# Patient Record
Sex: Male | Born: 1970 | Race: Black or African American | Hispanic: No | Marital: Married | Smoking: Current every day smoker
Health system: Southern US, Community
[De-identification: ages and names within clinical notes are randomized; demographics above are authoritative.]

## PROBLEM LIST (undated history)

## (undated) DIAGNOSIS — I10 Essential (primary) hypertension: Secondary | ICD-10-CM

## (undated) DIAGNOSIS — Z6836 Body mass index (BMI) 36.0-36.9, adult: Secondary | ICD-10-CM

## (undated) HISTORY — PX: CHOLECYSTECTOMY: SHX55

---

## 2003-12-22 ENCOUNTER — Emergency Department: Payer: Self-pay | Admitting: Emergency Medicine

## 2004-02-16 ENCOUNTER — Ambulatory Visit: Payer: Self-pay

## 2004-02-23 ENCOUNTER — Emergency Department: Payer: Self-pay | Admitting: Emergency Medicine

## 2005-05-21 ENCOUNTER — Emergency Department: Payer: Self-pay | Admitting: Emergency Medicine

## 2005-09-25 ENCOUNTER — Emergency Department: Payer: Self-pay | Admitting: Emergency Medicine

## 2005-10-04 ENCOUNTER — Emergency Department: Payer: Self-pay | Admitting: Emergency Medicine

## 2006-07-04 ENCOUNTER — Emergency Department: Payer: Self-pay | Admitting: Emergency Medicine

## 2006-07-04 ENCOUNTER — Other Ambulatory Visit: Payer: Self-pay

## 2006-10-16 ENCOUNTER — Emergency Department: Payer: Self-pay | Admitting: Emergency Medicine

## 2007-01-08 ENCOUNTER — Ambulatory Visit: Payer: Self-pay | Admitting: Pain Medicine

## 2007-07-23 ENCOUNTER — Emergency Department: Payer: Self-pay | Admitting: Emergency Medicine

## 2007-09-28 ENCOUNTER — Emergency Department: Payer: Self-pay | Admitting: Emergency Medicine

## 2008-01-26 ENCOUNTER — Emergency Department: Payer: Self-pay | Admitting: Emergency Medicine

## 2008-06-17 ENCOUNTER — Emergency Department: Payer: Self-pay | Admitting: Unknown Physician Specialty

## 2008-06-18 ENCOUNTER — Emergency Department: Payer: Self-pay | Admitting: Emergency Medicine

## 2008-07-27 ENCOUNTER — Emergency Department: Payer: Self-pay | Admitting: Emergency Medicine

## 2008-07-29 ENCOUNTER — Emergency Department: Payer: Self-pay | Admitting: Emergency Medicine

## 2008-08-23 ENCOUNTER — Emergency Department: Payer: Self-pay | Admitting: Emergency Medicine

## 2009-06-10 ENCOUNTER — Emergency Department: Payer: Self-pay | Admitting: Emergency Medicine

## 2010-01-31 ENCOUNTER — Emergency Department: Payer: Self-pay | Admitting: Emergency Medicine

## 2010-06-08 ENCOUNTER — Emergency Department: Payer: Self-pay | Admitting: Emergency Medicine

## 2010-06-28 ENCOUNTER — Emergency Department: Payer: Self-pay | Admitting: Emergency Medicine

## 2010-08-15 ENCOUNTER — Emergency Department: Payer: Self-pay | Admitting: Emergency Medicine

## 2010-08-29 ENCOUNTER — Emergency Department: Payer: Self-pay | Admitting: Emergency Medicine

## 2011-01-17 ENCOUNTER — Emergency Department: Payer: Self-pay | Admitting: Internal Medicine

## 2011-01-24 ENCOUNTER — Emergency Department: Payer: Self-pay | Admitting: Emergency Medicine

## 2011-10-02 ENCOUNTER — Emergency Department: Payer: Self-pay | Admitting: Emergency Medicine

## 2011-11-18 ENCOUNTER — Emergency Department: Payer: Self-pay | Admitting: Emergency Medicine

## 2011-11-18 LAB — COMPREHENSIVE METABOLIC PANEL
Albumin: 4.2 g/dL (ref 3.4–5.0)
Alkaline Phosphatase: 85 U/L (ref 50–136)
Anion Gap: 7 (ref 7–16)
Bilirubin,Total: 0.4 mg/dL (ref 0.2–1.0)
Creatinine: 1.14 mg/dL (ref 0.60–1.30)
EGFR (African American): >60
Glucose: 80 mg/dL (ref 65–99)
Osmolality: 277 (ref 275–301)
Potassium: 3.8 mmol/L (ref 3.5–5.1)
Sodium: 140 mmol/L (ref 136–145)
Total Protein: 7.8 g/dL (ref 6.4–8.2)

## 2011-11-18 LAB — CBC
HCT: 46.1 % (ref 40.0–52.0)
HGB: 16.2 g/dL (ref 13.0–18.0)
MCH: 31.8 pg (ref 26.0–34.0)
MCHC: 35.2 g/dL (ref 32.0–36.0)
MCV: 90 fL (ref 80–100)
Platelet: 208 10*3/uL (ref 150–440)
RBC: 5.11 10*6/uL (ref 4.40–5.90)

## 2011-11-18 LAB — LIPASE, BLOOD: Lipase: 171 U/L (ref 73–393)

## 2011-11-18 LAB — TROPONIN I: Troponin-I: <0.02 ng/mL

## 2012-01-26 ENCOUNTER — Emergency Department: Payer: Self-pay | Admitting: Emergency Medicine

## 2012-02-05 ENCOUNTER — Emergency Department: Payer: Self-pay | Admitting: Emergency Medicine

## 2012-08-15 ENCOUNTER — Emergency Department: Payer: Self-pay | Admitting: Emergency Medicine

## 2012-08-15 LAB — CBC
HCT: 44.2 % (ref 40.0–52.0)
HGB: 15.2 g/dL (ref 13.0–18.0)
MCH: 31.1 pg (ref 26.0–34.0)
MCHC: 34.4 g/dL (ref 32.0–36.0)
MCV: 90 fL (ref 80–100)
Platelet: 179 10*3/uL (ref 150–440)
RDW: 14 % (ref 11.5–14.5)
WBC: 12.8 10*3/uL — ABNORMAL HIGH (ref 3.8–10.6)

## 2012-08-15 LAB — URINALYSIS, COMPLETE
Bacteria: NONE SEEN
Bilirubin,UR: NEGATIVE
Glucose,UR: NEGATIVE mg/dL (ref 0–75)
Leukocyte Esterase: NEGATIVE
Nitrite: NEGATIVE
Ph: 6 (ref 4.5–8.0)
Protein: NEGATIVE
Squamous Epithelial: NONE SEEN
WBC UR: <1 /HPF (ref 0–5)

## 2012-08-15 LAB — COMPREHENSIVE METABOLIC PANEL
Alkaline Phosphatase: 90 U/L (ref 50–136)
Anion Gap: 6 — ABNORMAL LOW (ref 7–16)
BUN: 11 mg/dL (ref 7–18)
Calcium, Total: 9 mg/dL (ref 8.5–10.1)
Chloride: 108 mmol/L — ABNORMAL HIGH (ref 98–107)
Co2: 26 mmol/L (ref 21–32)
Creatinine: 1.03 mg/dL (ref 0.60–1.30)
EGFR (Non-African Amer.): >60
Glucose: 90 mg/dL (ref 65–99)
SGPT (ALT): 30 U/L (ref 12–78)

## 2012-11-11 ENCOUNTER — Emergency Department: Payer: Self-pay | Admitting: Emergency Medicine

## 2012-11-11 LAB — CBC WITH DIFFERENTIAL/PLATELET
Eosinophil #: 0.2 10*3/uL (ref 0.0–0.7)
HCT: 42.6 % (ref 40.0–52.0)
HGB: 14.9 g/dL (ref 13.0–18.0)
Lymphocyte %: 28.5 %
MCHC: 34.9 g/dL (ref 32.0–36.0)
MCV: 91 fL (ref 80–100)
Monocyte %: 5.8 %
Neutrophil %: 63.1 %
Platelet: 192 10*3/uL (ref 150–440)
RBC: 4.69 10*6/uL (ref 4.40–5.90)
RDW: 13.1 % (ref 11.5–14.5)

## 2012-11-11 LAB — DRUG SCREEN, URINE
Amphetamines, Ur Screen: NEGATIVE (ref ?–1000)
Barbiturates, Ur Screen: NEGATIVE (ref ?–200)
Cocaine Metabolite,Ur ~~LOC~~: NEGATIVE (ref ?–300)
MDMA (Ecstasy)Ur Screen: NEGATIVE (ref ?–500)
Methadone, Ur Screen: NEGATIVE (ref ?–300)
Opiate, Ur Screen: NEGATIVE (ref ?–300)
Phencyclidine (PCP) Ur S: NEGATIVE (ref ?–25)
Tricyclic, Ur Screen: NEGATIVE (ref ?–1000)

## 2012-11-11 LAB — URINALYSIS, COMPLETE
Bacteria: NONE SEEN
Blood: NEGATIVE
Glucose,UR: NEGATIVE mg/dL (ref 0–75)
Hyaline Cast: 3
Leukocyte Esterase: NEGATIVE
Specific Gravity: 1.033 (ref 1.003–1.030)
Squamous Epithelial: NONE SEEN
WBC UR: 1 /HPF (ref 0–5)

## 2012-11-11 LAB — BASIC METABOLIC PANEL
Anion Gap: 6 — ABNORMAL LOW (ref 7–16)
Chloride: 106 mmol/L (ref 98–107)
EGFR (African American): >60
EGFR (Non-African Amer.): >60
Osmolality: 274 (ref 275–301)

## 2012-11-11 LAB — SALICYLATE LEVEL: Salicylates, Serum: 10 mg/dL — ABNORMAL HIGH

## 2012-12-22 ENCOUNTER — Observation Stay: Payer: Self-pay | Admitting: Surgery

## 2012-12-22 DIAGNOSIS — E876 Hypokalemia: Secondary | ICD-10-CM

## 2012-12-22 DIAGNOSIS — I10 Essential (primary) hypertension: Secondary | ICD-10-CM

## 2012-12-22 DIAGNOSIS — R079 Chest pain, unspecified: Secondary | ICD-10-CM

## 2012-12-22 DIAGNOSIS — R1013 Epigastric pain: Secondary | ICD-10-CM

## 2012-12-22 LAB — HEPATIC FUNCTION PANEL A (ARMC)
Albumin: 3.9 g/dL (ref 3.4–5.0)
Alkaline Phosphatase: 79 U/L
Bilirubin, Direct: <0.1 mg/dL (ref 0.00–0.20)
Bilirubin,Total: 0.3 mg/dL (ref 0.2–1.0)
SGOT(AST): 43 U/L — ABNORMAL HIGH (ref 15–37)
SGPT (ALT): 77 U/L (ref 12–78)
Total Protein: 7.4 g/dL (ref 6.4–8.2)

## 2012-12-22 LAB — BASIC METABOLIC PANEL
Anion Gap: 5 — ABNORMAL LOW (ref 7–16)
Co2: 27 mmol/L (ref 21–32)
Creatinine: 1.02 mg/dL (ref 0.60–1.30)
Glucose: 112 mg/dL — ABNORMAL HIGH (ref 65–99)
Osmolality: 275 (ref 275–301)
Sodium: 138 mmol/L (ref 136–145)

## 2012-12-22 LAB — CBC
MCH: 31.8 pg (ref 26.0–34.0)
MCHC: 35.5 g/dL (ref 32.0–36.0)
MCV: 90 fL (ref 80–100)
RDW: 12.9 % (ref 11.5–14.5)
WBC: 17.5 10*3/uL — ABNORMAL HIGH (ref 3.8–10.6)

## 2012-12-22 LAB — TROPONIN I
Troponin-I: <0.02 ng/mL
Troponin-I: <0.02 ng/mL

## 2012-12-24 LAB — CBC WITH DIFFERENTIAL/PLATELET
Basophil %: 0.2 %
Eosinophil #: 0 10*3/uL (ref 0.0–0.7)
HCT: 46.7 % (ref 40.0–52.0)
HGB: 16.1 g/dL (ref 13.0–18.0)
Lymphocyte #: 2.1 10*3/uL (ref 1.0–3.6)
Lymphocyte %: 10.6 %
MCV: 92 fL (ref 80–100)
Monocyte %: 10.9 %
Neutrophil #: 15.6 10*3/uL — ABNORMAL HIGH (ref 1.4–6.5)
Neutrophil %: 78.3 %
Platelet: 182 10*3/uL (ref 150–440)
RBC: 5.1 10*6/uL (ref 4.40–5.90)
RDW: 13.3 % (ref 11.5–14.5)
WBC: 19.9 10*3/uL — ABNORMAL HIGH (ref 3.8–10.6)

## 2012-12-24 LAB — BILIRUBIN, DIRECT: Bilirubin, Direct: 1.4 mg/dL — ABNORMAL HIGH (ref 0.00–0.20)

## 2012-12-24 LAB — COMPREHENSIVE METABOLIC PANEL
Albumin: 3.5 g/dL (ref 3.4–5.0)
BUN: 7 mg/dL (ref 7–18)
Bilirubin,Total: 2.9 mg/dL — ABNORMAL HIGH (ref 0.2–1.0)
Calcium, Total: 8.8 mg/dL (ref 8.5–10.1)
Co2: 25 mmol/L (ref 21–32)
Creatinine: 0.97 mg/dL (ref 0.60–1.30)
EGFR (African American): >60
EGFR (Non-African Amer.): >60
Glucose: 102 mg/dL — ABNORMAL HIGH (ref 65–99)
Osmolality: 266 (ref 275–301)
Potassium: 3.6 mmol/L (ref 3.5–5.1)

## 2012-12-29 LAB — PATHOLOGY REPORT

## 2013-07-17 ENCOUNTER — Emergency Department: Payer: Self-pay | Admitting: Emergency Medicine

## 2013-08-16 ENCOUNTER — Emergency Department: Payer: Self-pay | Admitting: Emergency Medicine

## 2013-10-16 ENCOUNTER — Emergency Department: Payer: Self-pay | Admitting: Internal Medicine

## 2014-01-11 ENCOUNTER — Emergency Department: Payer: Self-pay | Admitting: Emergency Medicine

## 2014-01-31 ENCOUNTER — Emergency Department: Payer: Self-pay | Admitting: Emergency Medicine

## 2014-01-31 LAB — URINALYSIS, COMPLETE
BACTERIA: NONE SEEN
BILIRUBIN, UR: NEGATIVE
Glucose,UR: NEGATIVE mg/dL (ref 0–75)
KETONE: NEGATIVE
Leukocyte Esterase: NEGATIVE
Nitrite: NEGATIVE
Ph: 5 (ref 4.5–8.0)
Protein: 30
SQUAMOUS EPITHELIAL: NONE SEEN
Specific Gravity: 1.023 (ref 1.003–1.030)

## 2014-01-31 LAB — COMPREHENSIVE METABOLIC PANEL
ALK PHOS: 92 U/L
ALT: 41 U/L
Albumin: 4 g/dL (ref 3.4–5.0)
Anion Gap: 9 (ref 7–16)
BUN: 8 mg/dL (ref 7–18)
Bilirubin,Total: 0.5 mg/dL (ref 0.2–1.0)
CALCIUM: 8.9 mg/dL (ref 8.5–10.1)
Chloride: 102 mmol/L (ref 98–107)
Co2: 28 mmol/L (ref 21–32)
Creatinine: 1.2 mg/dL (ref 0.60–1.30)
EGFR (African American): >60
EGFR (Non-African Amer.): >60
Glucose: 100 mg/dL — ABNORMAL HIGH (ref 65–99)
Osmolality: 276 (ref 275–301)
POTASSIUM: 4.1 mmol/L (ref 3.5–5.1)
SGOT(AST): 27 U/L (ref 15–37)
Sodium: 139 mmol/L (ref 136–145)
Total Protein: 8.1 g/dL (ref 6.4–8.2)

## 2014-01-31 LAB — CBC WITH DIFFERENTIAL/PLATELET
Basophil #: 0.1 10*3/uL (ref 0.0–0.1)
Basophil %: 0.3 %
EOS PCT: 0.1 %
Eosinophil #: 0 10*3/uL (ref 0.0–0.7)
HCT: 46.6 % (ref 40.0–52.0)
HGB: 15.4 g/dL (ref 13.0–18.0)
LYMPHS ABS: 1.6 10*3/uL (ref 1.0–3.6)
Lymphocyte %: 9.6 %
MCH: 30.7 pg (ref 26.0–34.0)
MCHC: 33 g/dL (ref 32.0–36.0)
MCV: 93 fL (ref 80–100)
MONO ABS: 1.2 x10 3/mm — AB (ref 0.2–1.0)
Monocyte %: 7.3 %
Neutrophil #: 14 10*3/uL — ABNORMAL HIGH (ref 1.4–6.5)
Neutrophil %: 82.7 %
Platelet: 227 10*3/uL (ref 150–440)
RBC: 5 10*6/uL (ref 4.40–5.90)
RDW: 13.5 % (ref 11.5–14.5)
WBC: 16.9 10*3/uL — AB (ref 3.8–10.6)

## 2014-01-31 LAB — CLOSTRIDIUM DIFFICILE(ARMC)

## 2014-01-31 LAB — LIPASE, BLOOD: Lipase: 100 U/L (ref 73–393)

## 2014-02-01 LAB — WBCS, STOOL

## 2014-02-03 ENCOUNTER — Emergency Department: Payer: Self-pay | Admitting: Emergency Medicine

## 2014-03-02 LAB — STOOL CULTURE

## 2014-05-14 ENCOUNTER — Emergency Department
Admit: 2014-05-14 | Disposition: A | Discharge status: Home / Self Care | Payer: Self-pay | Admitting: Emergency Medicine

## 2014-05-21 NOTE — Consult Note (Signed)
General Aspect Joseph Stone is a 44yo male w/ no prior cardiac history PMHx s/f HTN, ongoing tobacco abuse and obesity who was admitted to [facility] today for suspected unstable angina.  He reports having an episode of constant substernal chest pain 1 month ago radiating to his left-sided chest lasting for several hours. No association with exertion. This remitted spontaneously after several hours. He does have a history of indigestion which occurs with certain trigger foods. He underwent a DOT evaluation last week at [facility]. EKG was performed which returned abnormal. Stress test was recommended which has yet to be completed.   He had an episode of abdominal pain and substernal chest pain radiating to his left-sided chest on Saturday. He reports this was similar to his prior episodes of indigestion. He had associated nausea and belching. He took some baking soda with gradual improvement and complete relief. This morning around 0030 he developed a similar episode with associated R arm "heaviness" and left neck/face numbness. The abdominal and chest discomfort were constant. He had an episode of NBNB x 2. He reports associated diaphoresis. Abdominal pain is always accompanied by chest pain. He denies SOB/DOE, PND, orthopnea, LE edema, palpitations, lightheadedness or syncope. No fevers, chills or abnormal bleeding. The discomfort worsened to a 10/10 this morning, and he presented to the ED.   Present Illness In the ED, he received a full dose ASA and NTG SL x 2 w/o relief. morphine sulfate was given with complete relief. EKG revealed inferolateral TWIs. Initial TnI WNL. CMP- K 3.1, AST- 43. Lipase, Mg WNL.  CBC- WBC 17.1K, otherwise WNL. CXR w/o acute abnormalities. Abdominal ultrasound revealed cholithiasis w/o cholecystitis.  PAST MEDICAL HISTORY:  HTN, tobacco abuse  PAST SURGICAL HISTORY:  None  ALLERGIES: No known drug allergies  FAMILY HISTORY: Father with MI in his mid to late 91s, passed of a MI at 66.    SOCIAL HISTORY: Lives in [place]. Works as a Dealer. Smokes 1 pack per day, 1-2 beers every month. Recently started taking 2 dietary/work-out supplements. Denies illicit drug use.   Physical Exam:  GEN no acute distress   HEENT pink conjunctivae, PERRL, hearing intact to voice   NECK supple  No masses  trachea midline  no JVD or bruits   RESP normal resp effort  clear BS  no use of accessory muscles   CARD Regular rate and rhythm  Normal, S1, S2  soft II/VI systolic flow murmur at LLSB   ABD soft  hypoactive BS  + epigastric tenderness   EXTR negative cyanosis/clubbing, negative edema   SKIN normal to palpation, skin turgor good   NEURO follows commands, motor/sensory function intact   PSYCH alert, A+O to time, place, person   Review of Systems:  Subjective/Chief Complaint abdominal pain   Cardiovascular: Chest pain or discomfort   Gastrointestinal: Nausea  abdominal pain   Review of Systems: All other systems were reviewed and found to be negative   Home Medications: Medication Instructions Status  amLODIPine 10 mg oral tablet 1 tab(s) orally once a day Active   Lab Results:  Hepatic:  24-Nov-14 04:32   Bilirubin, Total 0.3  Bilirubin, Direct < 0.1 (Result(s) reported on 22 Dec 2012 at 05:31AM.)  Alkaline Phosphatase 79 (45-117 NOTE: New Reference Range 12/19/12)  SGPT (ALT) 77  SGOT (AST)  43  Total Protein, Serum 7.4  Albumin, Serum 3.9  Routine Chem:  24-Nov-14 04:32   Magnesium, Serum 1.9 (1.8-2.4 THERAPEUTIC RANGE: 4-7 mg/dL TOXIC: > 10 mg/dL  -----------------------)  Lipase 137 (Result(s) reported on 22 Dec 2012 at 05:26AM.)  Glucose, Serum  112  BUN 9  Creatinine (comp) 1.02  Sodium, Serum 138  Potassium, Serum  3.1  Chloride, Serum 106  CO2, Serum 27  Calcium (Total), Serum 8.8  Anion Gap  5  Osmolality (calc) 275  eGFR (African American) >60  eGFR (Non-African American) >60 (eGFR values <68m/min/1.73 m2 may be an indication of  chronic kidney disease (CKD). Calculated eGFR is useful in patients with stable renal function. The eGFR calculation will not be reliable in acutely ill patients when serum creatinine is changing rapidly. It is not useful in  patients on dialysis. The eGFR calculation may not be applicable to patients at the low and high extremes of body sizes, pregnant women, and vegetarians.)  Cardiac:  24-Nov-14 04:32   Troponin I < 0.02 (0.00-0.05 0.05 ng/mL or less: NEGATIVE  Repeat testing in 3-6 hrs  if clinically indicated. >0.05 ng/mL: POTENTIAL  MYOCARDIAL INJURY. Repeat  testing in 3-6 hrs if  clinically indicated. NOTE: An increase or decrease  of 30% or more on serial  testing suggests a  clinically important change)  Routine Hem:  24-Nov-14 04:32   WBC (CBC)  17.5  RBC (CBC) 4.87  Hemoglobin (CBC) 15.5  Hematocrit (CBC) 43.6  Platelet Count (CBC) 208 (Result(s) reported on 22 Dec 2012 at 04:45AM.)  MCV 90  MCH 31.8  MCHC 35.5  RDW 12.9   EKG:  Interpretation NSR, TWIs V4-V6, II, III, aVF   Rate 85   EKG Comparision Not changed from  05/2010 or 10/2011 tracings    No Known Allergies:   Vital Signs/Nurse's Notes: **Vital Signs.:   24-Nov-14 07:50  Vital Signs Type Admission  Temperature Temperature (F) 97.7  Celsius 36.5  Temperature Source oral  Pulse Pulse 79  Respirations Respirations 18  Systolic BP Systolic BP 1295 Diastolic BP (mmHg) Diastolic BP (mmHg) 95  Mean BP 118  Pulse Ox % Pulse Ox % 97  Pulse Ox Activity Level  At rest  Oxygen Delivery Room Air/ 21 %  *Intake and Output.:   24-Nov-14 07:50  Weight Type admission  Weight Method Bed  Current Weight (lbs) (lbs) 222  Current Weight (kg) (kg) 100.6  Height Type stated  Height (ft) (feet) 5  Height (in) (in) 8  Height (cm) centimeters 172.7  BSA (m2) 2.1  BMI (kg/m2) 33.7    Impression 89yomale w/ no prior cardiac history PMHx s/f HTN, ongoing tobacco abuse and obesity [facility]  1. Epigastric/chest pain The patient reports a history of sharp epigastric pain with associated substernal chest pain radiating to his left-sided chest with associated R arm "heaviness" and left neck/face tingling. He had two such episodes this weekend lasting for several hours. He had multiple episodes of NBNB emesis. He relates the discomfort to prior episodes of indigestion. He has had no prior exertional chest pain or dyspnea. Objectively, initial TnI WNL. EKG does show anterolateral TWIs, but this is unchanged since 2012. Lipase WNL. CXR unremarkable. Abdominal u/s w/ cholelithiasis w/o cholecystitis. Systolic murmur on exam may be evidence of LVH vs aortic sclerosis/stenosis. Cardiac RFs include HTN, tobacco abuse, obesity. Father with MI in his 573s Unclear that this represents unstable angina. Symptoms fit more of a GI picture. Chest discomfort is always preceded by abdominal pain. This was not responsive to NTG. Non-exertional. He has epigastric tenderness to palpation. He works as a mDealer  and has not been limited functionally.  -- Agree with cycling troponins -- Will proceed with stress test. Unclear that this represents USAP; however, given baseline EKG changes, will evaluate for cardiac ischemia.  -- Obtain 2D echo -- GI cocktail PRN -- Low-dose ASA, start BB, NTG SL PRN -- Risk stratify with lipid panel, Hgb A1C -- Consider UDS  2. Hypertension -- Continue Norvasc -- Add carvedilol  3. Ongoing tobacco abuse Deciding on whether he is willing to quit this admission. Discussed the risks of tobacco use and options for nicotine replacement therapy.  -- Advised tobacco cessation  4. Leukocytosis WBC 17K. Afebrile. Possible reactive vs GI etiologies.  -- Continue to follow. Evaluation of infection process per primary team  5. Hypokalemia -- Replete   Electronic Signatures for Addendum Section:  Kathlyn Sacramento (MD) (Signed Addendum  [phone] 10:33)  The patient was seen and examined. Agree with the above. His symptoms are overall atypical and seem to be GI in nature. He does have TW changes on ECG but these do not seem to be new. Exam reveals no murmurs.  Agree with serial cardiac enzymes and risk stratification with a stress test. Consider outpatient GI evaluation if symptoms persist.   Electronic Signatures: Meriel Pica (PA-C)  (Signed 24-Nov-14 09:37)  Authored: General Aspect/Present Illness, History and Physical Exam, Review of System, Home Medications, Labs, EKG , Allergies, Vital Signs/Nurse's Notes, Impression/Plan Kathlyn Sacramento (MD)  (Signed [phone] 10:33)  Co-Signer: General Aspect/Present Illness, Home Medications, Labs, Allergies   Last Updated: 24-Nov-14 10:33 by Kathlyn Sacramento (MD)

## 2014-05-21 NOTE — Consult Note (Signed)
Brief Consult Note: Diagnosis: Choledocholithiasis.   Patient was seen by consultant.   Consult note dictated.   Recommend to proceed with surgery or procedure.   Discussed with Attending MD.   Comments: Mr. Joseph Stone is a pleasant 44 y/o male with acute cholecystitis with plans for cholecystectomy today, however his bilirubin has elevated to 2.9 along with transaminitis.  Likely he has choledocholithiasis.  ERCP planned for today with Dr. Servando SnareWohl for stone extraction, possible sphincterotomy & stent if needed.  Discussed risks/benefits of procedure which include but are not limited to pancreatitis, bleeding, infection, perforation & drug reaction.  Patient agrees with this plan & consent will be obtained.  Discussed with both Dr Anda KraftMarterre & Dr Servando SnareWohl & all are in agreement with plan.  Plan: 1) NPO 2) Consent 3) ERCP w/ Wohl 4) Continue supportive measures 5) Cholecystectomy post ERCP per surgery  Thanks for consult.  Please see full dictated note. #[id number].  Electronic Signatures: Joselyn ArrowJones, Sabin Gibeault L (NP)  (Signed [phone]-Nov-14 15:51)  Authored: Brief Consult Note   Last Updated: 26-Nov-14 15:51 by Joselyn ArrowJones, Kemyah Buser L (NP)

## 2014-05-21 NOTE — Discharge Summary (Signed)
PATIENT NAME:  Joseph BillingsWADE, Joseph Stone MR#:  [id number] DATE OF BIRTH:  11-13-1970  DATE OF ADMISSION:  12/22/2012 DATE OF DISCHARGE:  12/25/2012.  PRINCIPLE DIAGNOSIS:  Chronic active cholecystitis (chronic cholecystitis and acute cholecystitis) with choledocholithiasis.   OTHER DIAGNOSES:  Hypertension.   PRINCIPAL PROCEDURE PERFORMED DURING THIS ADMISSION:  Laparoscopic cholecystectomy, 12/25/2012.   OTHER PROCEDURES PERFORMED DURING THIS ADMISSION:  Endoscopic retrograde cholangiopancreatography, 12/24/2012.   HOSPITAL COURSE:  The patient was admitted to the hospital with chest pain but ultimately determined to have acute cholecystitis and was treated with IV antibiotics but then his bilirubin went up to 2.9 from an admission value of 0.3. His white blood cell count went up from 17 to 19.9. He underwent ERCP and had a sphincterotomy and 7-mm stone removed from his common bile duct and the following morning underwent laparoscopic cholecystectomy. He insisted on discharge following surgery. The patient was discharged home on Percocet and Norco and instructed only to take one of these two at a time and not to use Tylenol when using either of them. He was given an appointment to see me in 1 to 2 weeks and asked to call the office in the interim for a recurrence of his pain, fever, or jaundice symptoms.  ____________________________ Claude MangesWilliam F. Diyari Cherne, MD wfm:jm D: 12/25/2012 10:56:00 ET T: 12/25/2012 11:22:44 ET JOB#: [id number]  cc: Claude MangesWilliam F. Rivers Gassmann, MD, <Dictator>

## 2014-05-21 NOTE — H&P (Signed)
PATIENT NAME:  Joseph Stone, Joseph Stone MR#:  [id number] DATE OF BIRTH:  [birth year]  DATE OF ADMISSION:  12/22/2012  [facility]   REQUESTING PHYSICIAN: Dr. Zenda AlpersWebster.  CHIEF COMPLAINT: Chest pain/abdominal pain.  HISTORY OF PRESENT ILLNESS: The patient is a 44 year old male with a history of hypertension and smoking who is being admitted for suspected unstable angina. The patient had a DOT Physical last week where he was found to have abnormal EKG, [facility] and was requested to get a stress test. Last night he started having epigastric pain, which was unbearable, about 10 of 10, woke up from sleep around 12:30 to 1:00 a.m.. His pain was unrelenting and decided to come to the Emergency Department. While in the ED, he started having radiation to the left arm and his pain was getting more under his left lower chest. He also had some numbness in the arm and jaw. He was found to have some flipped T waves in leads II, III, and aVF on EKG, and he is being admitted for further evaluation and management. His pain is still 9 out of 10 in severity and seems uncomfortable. He had nausea, vomiting several tiems since last night, No fever. He does admit to starting some new diet pill last Saturday.  PAST MEDICAL HISTORY: Hypertension.   SOCIAL HISTORY: Smokes 1 pack of cigarettes daily. He has been trying to quit. He drinks occasionally. He works as a Artistdeiseal mechanic.   FAMILY HISTORY: Father had MI.  HOME MEDICATIONS: Norvasc 10 mg p.o. daily.   ALLERGIES: No known drug allergies.  REVIEW OF SYSTEMS: CONSTITUTIONAL: No fever, fatigue, weakness.  EYES: No blurred or double vision.  ENT: No tinnitus or ear pain.  RESPIRATORY: No cough, wheezing, hemoptysis.  CARDIOVASCULAR: Positive for chest pain. No orthopnea or edema.  GASTROINTESTINAL: Positive for nausea and vomiting and a couple of times. No diarrhea. Also positive for epigastric pain. GENITOURINARY: No  dysuria or hematuria.  ENDOCRINE: No polyuria or nocturia.  HEMATOLOGIC: No anemia or easy bruising.  SKIN: No rash or lesion.  MUSCULOSKELETAL: No arthritis or muscle cramp.  NEUROLOGIC: No tingling, numbness, weakness.  PSYCHIATRIC: No history of anxiety or depression.   PHYSICAL EXAMINATION: VITAL SIGNS: Temperature 97.7, heart rate 88 per minute, respirations 22 per minute, blood pressure 187/96 mmHg, and he is saturation 98% on room air. GENERAL: The patient is a 44 year old male lying in the bed in some pain.  EYES: Pupils equal, round, and reactive to light and accommodation. No scleral icterus. Extraocular muscles intact.  HEENT: Head atraumatic, normocephalic. Oropharynx and nasopharynx clear.  NECK: Supple. No jugular venous distention. No thyroid enlargement or tenderness.  LUNGS: Clear to auscultation bilaterally. No wheezing, rales, rhonchi, or crepitation.  CARDIOVASCULAR: S1 and S2 normal. No murmurs, rubs, or gallop.  ABDOMEN: Soft, nontender, and nondistended. Bowel sounds present. No organomegaly or mass.  EXTREMITIES: No pedal edema, cyanosis, or clubbing. NEUROLOGIC: Nonfocal examination. Cranial nerves II through XII intact. Muscle strength 5/5 in all extremities. Sensation intact. PSYCH:  The patient is alert and oriented x3.  SKIN: No obvious rash, lesion, or ulcer.   LABORATORY AND DIAGNOSTICS: Normal BMP, except Potassium of 3.1. Normal liver function tests, except AST of 43. Normal first set of troponins. Normal CBC except white count of 17.5.   Chest x-ray in the Emergency Department showed no acute cardiopulmonary disease.   EKG showed flipped T waves in leads II, III, an aVF compared to previous  EKG. Normal sinus rhythm, heart rate of 85 beats per minute.   Abdominal ultrasound showed cholelithiasis with stones and sludge in the gallbladder, unchanged from previous study.   IMPRESSION AND PLAN: 1.  Suspected unstable angina. Will start him on aspirin and  nitroglycerin, consult cardiology and obtain Myoview.  2.  Abnormal EKG with flipped T waves in leads II, III, and aVF. Discussed with cardiology, Dr. Kirke Corin. For now we will scheduled him for stress test and start him on aspirin and nitroglycerin.  3.  Hypokalemia. We will replete and recheck. Check magnesium.  4.  Hypertension. We will continue Norvasc, and we will admit him to telemetry.  5.  Tobacco abuse. The patient was counseled for about 3 minutes. He denies any need for nicotine replacement therapy while in the hospital. He is trying to quit.  This certainly could be gallbladder also. If he continues having pain, consider surgical consult here otherwise outpatient evaluation may be ok.   CODE STATUS: FULL CODE.   TOTAL TIME TAKING CARE OF THIS PATIENT: 55 minutes.  ____________________________ Ellamae Sia. Sherryll Burger, MD vss:sb D: 12/22/2012 07:05:01 ET T: 12/22/2012 07:48:06 ET JOB#: [id number]  cc: Vontrell Pullman S. Sherryll Burger, MD, <Dictator> Ellamae Sia [facility] MD ELECTRONICALLY SIGNED 12/29/2012 10:05

## 2014-05-21 NOTE — Op Note (Signed)
PATIENT NAME:  Joseph Stone, Joseph Stone MR#:  [id number] DATE OF BIRTH:  04-27-1970  DATE OF PROCEDURE:  12/25/2012  PREOPERATIVE DIAGNOSIS: Acute cholecystitis.  POSTOPERATIVE DIAGNOSIS: Chronic active cholecystitis (acute and chronic cholecystitis).   PROCEDURE PERFORMED: Laparoscopic cholecystectomy.  SURGEON: Duwaine MaxinWilliam Annella Prowell, MD   ANESTHESIA: General.   PROCEDURE IN DETAIL: The patient was placed supine on the operating room table and prepped and draped in the usual sterile fashion. A 15 mmHg CO2 pneumoperitoneum was created via a Veress needle, in the infraumbilical position, and this was replaced with a 30 degree angled laparoscope through a 5 mm trocar. Remaining trocars were placed under direct visualization. The gallbladder was decompressed as much as I could of bile, and the gallbladder wall was exceedingly thick and very difficult to grasp, but ultimately the fundus was grasped and retracted superiorly and ventrally and very dense, thick, and chronic adhesions to the visceral surface of the gallbladder were taken down with the electrocautery. Ultimately, the infundibulum was dissected out and retracted laterally and extreme retraction of the gallbladder to the right side opened up the triangle of Calot. Staying right on the gallbladder wall, the cystic artery was identified and doubly clipped and divided. The cystic duct was identified and doubly clipped with Hem-O-Lok clips and divided, and the gallbladder was removed from the liver bed with the electrocautery. It was placed in an Endo Catch bag and extracted from the abdomen via the supraumbilical midline port site. This port site required significant enlargement (to approximally 5 cm) due to the thickness of the wall of the gallbladder. The peritoneum was temporarily desufflated and the linea alba was closed with a running 0 PDS suture extracorporeally and then the peritoneum was re-insufflated and re-inspected, and the right upper quadrant was  hemostatic with no evidence of bile staining. Copious amounts of warm irrigation, in the right upper quadrant, was performed and this was all suctioned clear. All of the clips were secure. The peritoneum was then desufflated and decannulated, and all 4 skin sites were closed with subcuticular 5-0 Monocryl and suture strips. The patient tolerated the procedure well. There were no complications.  ____________________________ Claude MangesWilliam F. Nelani Schmelzle, MD wfm:sb D: 12/25/2012 10:47:48 ET T: 12/25/2012 11:15:07 ET JOB#: [id number]  cc: Claude MangesWilliam F. Mateusz Neilan, MD, <Dictator> Claude MangesWILLIAM F Tehran Rabenold MD ELECTRONICALLY SIGNED 12/26/2012 8:44

## 2014-05-21 NOTE — Consult Note (Signed)
PATIENT NAME:  Joseph Stone, Joseph Stone MR#:  [id number] DATE OF BIRTH:  [birth year]  DATE OF CONSULTATION:  12/22/2012  REFERRING PHYSICIAN:   CONSULTING PHYSICIAN:  Joseph Trupiano A. Egbert Garibaldi, MD  REASON FOR CONSULTATION: Abdominal pain and cholelithiasis.   HISTORY: This is a 44 year old male, admitted to the medical service with chest and abdominal pain. He was seen to have an abnormal EKG, which appears to be unchanged from previous visits. He has been seen by cardiology. Stress test is negative. Feeling is that this is more of GI origin.   The patient does admit to previous episodes of this type of discomfort in the past, which is accompanied by indigestion, some mild nausea, but no history of emesis, no jaundice and no fever. Pain typically occurs in his upper middle abdomen and his right upper quadrant on previous occasions in the past, over the last year and a half to two years. He describes at least 10 different episodes in the past. These are usually after fatty meals or spicy meals. Of note, the patient was seen in the Emergency Room in October 2013 with abdominal pain, was diagnosed with suspected biliary colic after an ultrasound at the time demonstrated cholelithiasis. The patient states that he was seen at [facility] surgery, and was told that he would likely benefit from a cholecystectomy or that he could try dietary changes. That was approximately one year ago.   He was admitted with severe upper abdominal pain which started on Saturday.  Currently, he is still having some discomfort, although somewhat improved compared to admission. His abdominal pain did  not respond wo treatment with sublingual nitroglycerin, however, he did respond to relief of his pain with morphine in the Emergency Room. Stress test earlier today in nuclear medicine is unremarkable. He has been seen by Dr. Kirke Corin of cardiology. The patient does have a history of hypertension, tobacco abuse and dependence. He has had no previous abdominal  operations. Surgical services were asked to evaluate for possible cholecystectomy.  ALLERGIES: NONE.   MEDICATIONS AT HOME: Amlodipine 10 mg by mouth once a day.   INPATIENT MEDICATIONS: Norvasc, nitroglycerin patch, Protonix, aspirin, morphine, Zofran, enteric coated aspirin, Tylenol, Percocet, Mylanta, Colace, oxygen, Senokot, IV fluids.   PAST MEDICAL HISTORY: Hypertension, mild obesity.   PAST SURGICAL HISTORY: None.   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 He denies any exertional chest pain or shortness of breath.   REVIEW OF SYSTEMS: As described above. No fever. No jaundice. No weakness. No shortness of breath.   PHYSICAL EXAMINATION: GENERAL:  The patient is alert and oriented, comfortable-appearing. No obvious distress.  VITAL SIGNS: Temperature is 98.5, pulse of 80, blood pressure 175/100. Pulse oximetry on room air is 95%.  LUNGS: Clear.  HEART: Regular rate and rhythm.  ABDOMEN:  Soft, tender in the right upper quadrant and mid-epigastrium, with Joseph Stone sign is present. No hernias. No masses. No scars.  EXTREMITIES: Warm and well perfused.  NEUROLOGIC AND PSYCHIATRIC: Unremarkable.  RECTAL AND GENITOURINARY: Deferred.   LABORATORY VALUES: Glucose 112, BUN 9, creatinine 1.02, sodium 138, potassium 3.1, chloride 106, CO2 is 27. Liver function tests are normal except for AST slightly elevated at 4.3. Troponins x3 q.8 hours are all negative. White count is 17.5, hemoglobin 15.5, hematocrit 43.6, platelet count 208,000. Review of chest x-ray is unremarkable. Review of ultrasound demonstrates multiple stones and sludge. No gallbladder wall thickening. At that time a  negative Joseph Stone sign was present. Bile duct  measured 5.3 mm in size, which is normal. Normal liver echogenicity, and no focal lesions seen. It was similar in appearance to previous study dated 11/19/2011.   IMPRESSION: This is a 44 year old black male with a history of  hypertension and 1 pack per day cigarette abuse and dependence, presenting with abdominal and chest pain, with negative cardiac work-up but abnormal EKG from previous admission. His symptom complex appears to be consistent with cholelithiasis and possibly acute cholecystitis, given his elevated white count. The duration of his symptoms suggestive of an evolving gallbladder hydrops.   RECOMMENDATIONS: At present, I would attempt a HIDA scan tomorrow morning to visualize the cystic duct and gallbladder. If this is positive for cystic duct obstruction, the patient would benefit from cholecystectomy during this admission. The patient is in agreement with this plan.   TOTAL TIME SPENT: 45 minutes.     ____________________________ Redge GainerMark A. Egbert GaribaldiBird, MD mab:cg D: 12/22/2012 22:26:03 ET T: 12/23/2012 00:59:30 ET JOB#: [id number]  cc: Loraine LericheMark A. Egbert GaribaldiBird, MD, <Dictator> Kendarius Vigen A Emannuel Vise MD ELECTRONICALLY SIGNED 12/23/2012 21:18

## 2014-05-21 NOTE — Consult Note (Signed)
PATIENT NAME:  Joseph BillingsWADE, Oryn C MR#:  [id number] DATE OF BIRTH:  [birth year]  DATE OF CONSULTATION:  12/24/2012  REQUESTING PHYSICIAN:  Dr. Anda KraftMarterre  CONSULTING PHYSICIAN:  Gastroenterologist Dr. Okey Duprearren Wohl/Fleta Borgeson, NP  PRIMARY CARE PHYSICIAN:  Not applicable.   REASON FOR CONSULTATION: Choledocholithiasis.   HISTORY OF PRESENT ILLNESS: Mr. Leone BrandWaite is a pleasant 44 year old black male who was in his usual state of health until 4 days ago when he developed severe chest and abdominal pain. He tells me the pain was very severe. It did not radiate to his back; however, it was throughout his entire abdomen and chest. The pain was 10 out of 10 on pain scale. The pain today is two out of 10. He also had multiple episodes of nausea and vomiting as well as cold sweats with the pain. He denies any jaundice or pruritus. He denies any fever. He had a white blood cell count of 17.5 on arrival, 19.9 today. Total bilirubin jumped to 2.9 today after having been normal upon admission. His alkaline phosphatase also went from 79 to 207, AST from 43 to 145, and ALT 77 to 384. He had an ultrasound which showed multiple stones and gallbladder sludge and a common bile duct of 5.3. He had a HIDA scan today, which showed nonfilling of the gallbladder after 120 minutes, highly concerning for acute cholecystitis.    [facility] and was told that he had "gallbladder problems"; however, he has been putting off having cholecystectomy and trying to change his diet first.  PAST MEDICAL AND SURGICAL HISTORY: Hypertension, obesity, and tobacco abuse, allergic rhinitis.  No surgeries.   MEDICATIONS PRIOR TO ADMISSION: Amlodipine 10 mg daily.   ALLERGIES: No known drug allergies.   FAMILY HISTORY: There is no family history of  colorectal carcinoma, liver or chronic GI problems.   SOCIAL HISTORY: He is single. Has three healthy grown children. He has a 10 pack-year history of tobacco abuse. He consumes 3 to 4  beers per month. He denies any illicit drug use. He is a Games developerdiesel mechanic.   REVIEW OF SYSTEMS: See history of present illness.  NEUROLOGICAL: He has had a headache since admission to the hospital which he feels may be due to some of the pain medications that he is on. Denies any neurologic symptoms with it including dizziness, change in vision or weakness. Otherwise, negative 12 point review of systems.   PHYSICAL EXAMINATION: VITAL SIGNS: Temperature 97, pulse 111, respirations 19, blood pressure 114/72, O2 sat 96% on room air.  GENERAL: He is a well-developed, well-nourished male in no acute distress. He is accompanied by his mother and sister.  HEENT: Sclerae clear, anicteric. Conjunctivae pink. Oropharynx pink and moist without any lesions.  NECK: Supple without mass or thyromegaly.  CHEST: Heart regular rhythm. Normal S1, S2. No murmurs, clicks, rubs or gallops.  LUNGS: Clear to auscultation bilaterally.  ABDOMEN: Positive bowel sounds x 4. No bruits auscultated. Abdomen is soft, nondistended. He does have moderate tenderness to the epigastrium and right upper quadrant on deep palpation. There is no rebound, tenderness or guarding.  EXTREMITIES: Without clubbing or edema.  SKIN: Warm and dry without any rash or jaundice.  NEUROLOGIC: Grossly intact.  PSYCHIATRIC: Alert, oriented. Normal mood and affect.   LABORATORY STUDIES: See history of present illness. Glucose 102, sodium 134, otherwise normal basic metabolic panel. Lipase 137. Magnesium 1.9, total protein 7.4, albumin 3.5. Troponins negative x 3, hemoglobin 16.1, hematocrit 46.7, platelets 182.  IMPRESSION: Mr. Ator is a pleasant 44 year old black male with acute cholecystitis with plans for cholecystectomy today; however, his bilirubin is elevated to 2.9 along with transaminitis. Likely he has choledocholithiasis. ERCP is planned for today with Dr. Servando Snare for stone extraction, possible sphincterotomy and stent if needed. Discussed  risks, benefits, and procedures which include but not limited to bleeding, infection, perforation, drug reaction, and pancreatitis. The patient agrees with the plan and consent will be obtained. I have discussed with both Dr. Anda Kraft and Dr. Servando Snare and all are in agreement with the plan.   PLAN: 1.  N.p.o.  2.  Consent will be obtained.  3.  ERCP with Dr. Servando Snare.  4.  Continue supportive measures.  5.  Cholecystectomy post ERCP per surgery.    ____________________________ Joselyn Arrow, NP klj:dp D: 12/24/2012 15:50:26 ET T: 12/24/2012 16:25:27 ET JOB#: [id number]  cc: Joselyn Arrow, NP, <Dictator> Joselyn Arrow FNP ELECTRONICALLY SIGNED 01/05/2013 15:33

## 2014-05-21 NOTE — Consult Note (Signed)
Brief Consult Note: Diagnosis: abdominal pain cholelithiasis, negative stress test.   Patient was seen by consultant.   Consult note dictated.   Recommend further assessment or treatment.   Orders entered.   Comments: Plan HIDA scan tuesday.  If positive for systic duct obstruction then needs CCY during this admission.  Electronic Signatures: Natale LayBird, Jaylie Neaves (MD)  (Signed [phone]-Nov-14 22:10)  Authored: Brief Consult Note   Last Updated: 24-Nov-14 22:10 by Natale LayBird, Jarell Mcewen (MD)

## 2014-12-30 ENCOUNTER — Ambulatory Visit
Admission: EM | Admit: 2014-12-30 | Discharge: 2014-12-30 | Disposition: A | Discharge status: Home / Self Care | Payer: Worker's Compensation | Attending: Emergency Medicine | Admitting: Emergency Medicine

## 2014-12-30 ENCOUNTER — Encounter: Payer: Self-pay | Admitting: Emergency Medicine

## 2014-12-30 ENCOUNTER — Ambulatory Visit (INDEPENDENT_AMBULATORY_CARE_PROVIDER_SITE_OTHER): Payer: Worker's Compensation

## 2014-12-30 DIAGNOSIS — S8391XA Sprain of unspecified site of right knee, initial encounter: Secondary | ICD-10-CM

## 2014-12-30 DIAGNOSIS — S93401A Sprain of unspecified ligament of right ankle, initial encounter: Secondary | ICD-10-CM

## 2014-12-30 DIAGNOSIS — M899 Disorder of bone, unspecified: Secondary | ICD-10-CM

## 2014-12-30 HISTORY — DX: Essential (primary) hypertension: I10

## 2014-12-30 NOTE — ED Provider Notes (Signed)
CSN: [id number]     Arrival date & time 12/30/14  1456 History   None    Chief Complaint  Patient presents with  . Worker's Comp Injury   . Ankle Pain  . Knee Pain   (Consider location/radiation/quality/duration/timing/severity/associated sxs/prior Treatment) HPI 44 Y/O MALE stepped in pot hole at work twisting right ankle and knee. Pain in both areas, but able to weight bear. No pre clinic treatment. Pain is constant. No previous fracture history. Past Medical History  Diagnosis Date  . Hypertension    Past Surgical History  Procedure Laterality Date  . Cholecystectomy     History reviewed. No pertinent family history. Social History  Substance Use Topics  . Smoking status: Current Every Day Smoker -- 1.00 packs/day    Types: Cigarettes  . Smokeless tobacco: None  . Alcohol Use: Yes    Review of Systems +'ve right knee, ankle injury Denies: numbness, bleeding, swelling Allergies  Review of patient's allergies indicates no known allergies.  Home Medications   Prior to Admission medications   Medication Sig Start Date End Date Taking? Authorizing Provider  amLODipine (NORVASC) 10 MG tablet Take 10 mg by mouth daily.   Yes Historical Provider, MD  hydrochlorothiazide (HYDRODIURIL) 25 MG tablet Take 25 mg by mouth daily.   Yes Historical Provider, MD  loratadine (CLARITIN) 10 MG tablet Take 10 mg by mouth daily.   Yes Historical Provider, MD   Meds Ordered and Administered this Visit  Medications - No data to display  BP 158/86 mmHg  Pulse 106  Temp(Src) 98.5 F (36.9 C) (Tympanic)  Resp 16  Ht 5\' 8"  (1.727 m)  Wt 240 lb (108.863 kg)  BMI 36.50 kg/m2  SpO2 98% No data found.   Physical Exam  Constitutional: He appears well-developed and well-nourished.  Musculoskeletal: He exhibits tenderness.       Right knee: He exhibits swelling. He exhibits no deformity, no laceration, normal alignment and no MCL laxity. No tenderness found. No medial joint line and no  patellar tendon tenderness noted.       Right ankle: He exhibits decreased range of motion and swelling. He exhibits no ecchymosis. Tenderness. Lateral malleolus tenderness found. No proximal fibula tenderness found. Achilles tendon normal.  Nursing note and vitals reviewed.   ED Course  Procedures (including critical care time)  Labs Review Labs Reviewed - No data to display  Imaging Review Dg Ankle Complete Right  12/30/2014  CLINICAL DATA:  Right ankle and knee pain EXAM: RIGHT ANKLE - COMPLETE 3+ VIEW COMPARISON:  None. FINDINGS: No acute fracture. No dislocation. Spurring at the inferior calcaneus. There is a 5.4 cm sclerotic bone lesion in the distal tibia metaphysis. There is some expansion of the bone and thickening of the overlying cortex. Borders are somewhat ill-defined. IMPRESSION: No acute bony injury. Sclerotic bone lesion in the distal tibia. Metastatic disease is not excluded. Bone scan is recommended. Electronically Signed   By: Jolaine ClickArthur  Hoss M.D.   On: 12/30/2014 16:03   Dg Knee Complete 4 Views Right  12/30/2014  CLINICAL DATA:  Right knee pain after twisting injury. Initial encounter. EXAM: RIGHT KNEE - COMPLETE 4+ VIEW COMPARISON:  None. FINDINGS: There is no evidence of fracture, dislocation, or joint effusion. There is no evidence of arthropathy or other focal bone abnormality. Soft tissues are unremarkable. IMPRESSION: Normal right knee. Electronically Signed   By: Lupita RaiderJames  Green Jr, M.D.   On: 12/30/2014 16:05     Visual Acuity Review  Right  Eye Distance:   Left Eye Distance:   Bilateral Distance:    Right Eye Near:   Left Eye Near:    Bilateral Near:         MDM   1. Knee sprain and strain, right, initial encounter   2. Ankle sprain, right, initial encounter   3. 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   Discussed with Dr Hyacinth Meeker from orthopedics and he is happy to follow up patient.   Pt not sure he wants a specialist and states  he has a f/u with his PCP tomorrow.   Offered crutches, knee immobilizer, analgesia declined by patient  Light duty at work prescribed.     Tharon Aquas, PA 12/30/14 [phone]

## 2014-12-30 NOTE — ED Notes (Signed)
Patient states that he stepped in a pothole in the parking lot at his work and twisted his right ankle and right knee today.  Patient reports right knee and right ankle pain.

## 2014-12-30 NOTE — Discharge Instructions (Signed)
Ankle Sprain An ankle sprain is an injury to the strong, fibrous tissues (ligaments) that hold the bones of your ankle joint together.  CAUSES An ankle sprain is usually caused by a fall or by twisting your ankle. Ankle sprains most commonly occur when you step on the outer edge of your foot, and your ankle turns inward. People who participate in sports are more prone to these types of injuries.  SYMPTOMS   Pain in your ankle. The pain may be present at rest or only when you are trying to stand or walk.  Swelling.  Bruising. Bruising may develop immediately or within 1 to 2 days after your injury.  Difficulty standing or walking, particularly when turning corners or changing directions. DIAGNOSIS  Your caregiver will ask you details about your injury and perform a physical exam of your ankle to determine if you have an ankle sprain. During the physical exam, your caregiver will press on and apply pressure to specific areas of your foot and ankle. Your caregiver will try to move your ankle in certain ways. An X-ray exam may be done to be sure a bone was not broken or a ligament did not separate from one of the bones in your ankle (avulsion fracture).  TREATMENT  Certain types of braces can help stabilize your ankle. Your caregiver can make a recommendation for this. Your caregiver may recommend the use of medicine for pain. If your sprain is severe, your caregiver may refer you to a surgeon who helps to restore function to parts of your skeletal system (orthopedist) or a physical therapist. HOME CARE INSTRUCTIONS   Apply ice to your injury for 1-2 days or as directed by your caregiver. Applying ice helps to reduce inflammation and pain.  Put ice in a plastic bag.  Place a towel between your skin and the bag.  Leave the ice on for 15-20 minutes at a time, every 2 hours while you are awake.  Only take over-the-counter or prescription medicines for pain, discomfort, or fever as directed by  your caregiver.  Elevate your injured ankle above the level of your heart as much as possible for 2-3 days.  If your caregiver recommends crutches, use them as instructed. Gradually put weight on the affected ankle. Continue to use crutches or a cane until you can walk without feeling pain in your ankle.  If you have a plaster splint, wear the splint as directed by your caregiver. Do not rest it on anything harder than a pillow for the first 24 hours. Do not put weight on it. Do not get it wet. You may take it off to take a shower or bath.  You may have been given an elastic bandage to wear around your ankle to provide support. If the elastic bandage is too tight (you have numbness or tingling in your foot or your foot becomes cold and blue), adjust the bandage to make it comfortable.  If you have an air splint, you may blow more air into it or let air out to make it more comfortable. You may take your splint off at night and before taking a shower or bath. Wiggle your toes in the splint several times per day to decrease swelling. SEEK MEDICAL CARE IF:   You have rapidly increasing bruising or swelling.  Your toes feel extremely cold or you lose feeling in your foot.  Your pain is not relieved with medicine. SEEK IMMEDIATE MEDICAL CARE IF:  Your toes are numb or  blue.  You have severe pain that is increasing. MAKE SURE YOU:   Understand these instructions.  Will watch your condition.  Will get help right away if you are not doing well or get worse.   This information is not intended to replace advice given to you by your health care provider. Make sure you discuss any questions you have with your health care provider.   Document Released: 01/15/2005 Document Revised: 02/05/2014 Document Reviewed: 01/27/2011 Elsevier Interactive Patient Education 2016 [employer] Knee Sprain A knee sprain is a tear in the strong bands of tissue that connect the bones (ligaments) of your  knee. HOME CARE  Raise (elevate) your injured knee to lessen puffiness (swelling).  To ease pain and puffiness, put ice on the injured area.  Put ice in a plastic bag.  Place a towel between your skin and the bag.  Leave the ice on for 20 minutes, 2-3 times a day.  Only take medicine as told by your doctor.  Do not leave your knee unprotected until pain and stiffness go away (usually 4-6 weeks).  If you have a cast or splint, do not get it wet. If your doctor told you to not take it off, cover it with a plastic bag when you shower or bathe. Do not swim.  Your doctor may have you do exercises to prevent or limit permanent weakness and stiffness. GET HELP RIGHT AWAY IF:   Your cast or splint becomes damaged.  Your pain gets worse.  You have a lot of pain, puffiness, or numbness below the cast or splint. MAKE SURE YOU:   Understand these instructions.  Will watch your condition.  Will get help right away if you are not doing well or get worse.   This information is not intended to replace advice given to you by your health care provider. Make sure you discuss any questions you have with your health care provider.   Document Released: 01/03/2009 Document Revised: 01/20/2013 Document Reviewed: 09/23/2012 Elsevier Interactive Patient Education 2016 Elsevier Inc[employer]   LIGHT DUTY AS ARRANGED BY YOUR SUPERVISOR.  FOLLOW UP WITH YOUR DOCTOR AS SCHEDULED  APPLY COLD COMPRESSES TO KNEE AND ANKLE TYLENOL OR MOTRIN FOR PAIN RETURN IF THERE ARE NEW OR WORSENING OF SYMPTOMS

## 2015-01-06 ENCOUNTER — Ambulatory Visit
Admission: EM | Admit: 2015-01-06 | Discharge: 2015-01-06 | Disposition: A | Discharge status: Home / Self Care | Payer: Worker's Compensation | Attending: Family Medicine | Admitting: Family Medicine

## 2015-01-06 DIAGNOSIS — M25571 Pain in right ankle and joints of right foot: Secondary | ICD-10-CM

## 2015-01-06 DIAGNOSIS — M25471 Effusion, right ankle: Secondary | ICD-10-CM | POA: Diagnosis not present

## 2015-01-06 DIAGNOSIS — M25474 Effusion, right foot: Secondary | ICD-10-CM | POA: Diagnosis not present

## 2015-01-06 DIAGNOSIS — M25561 Pain in right knee: Secondary | ICD-10-CM | POA: Diagnosis not present

## 2015-01-06 MED ORDER — MELOXICAM 15 MG PO TABS: 15.0000 mg | ORAL_TABLET | Freq: Every day | ORAL | Status: DC

## 2015-01-06 NOTE — ED Notes (Signed)
[facility] after twisting right ankle and right knee in a pot hole at work at the [employer] [place]. Had xrays done. Here for recheck. Right knee more swollen now. Right ankle "not quite as bad".

## 2015-01-06 NOTE — Discharge Instructions (Signed)
Cryotherapy Cryotherapy is when you put ice on your injury. Ice helps lessen pain and puffiness (swelling) after an injury. Ice works the best when you start using it in the first 24 to 48 hours after an injury. HOME CARE  Put a dry or damp towel between the ice pack and your skin.  You may press gently on the ice pack.  Leave the ice on for no more than 10 to 20 minutes at a time.  Check your skin after 5 minutes to make sure your skin is okay.  Rest at least 20 minutes between ice pack uses.  Stop using ice when your skin loses feeling (numbness).  Do not use ice on someone who cannot tell you when it hurts. This includes small children and people with memory problems (dementia). GET HELP RIGHT AWAY IF:  You have white spots on your skin.  Your skin turns blue or pale.  Your skin feels waxy or hard.  Your puffiness gets worse. MAKE SURE YOU:   Understand these instructions.  Will watch your condition.  Will get help right away if you are not doing well or get worse.   This information is not intended to replace advice given to you by your health care provider. Make sure you discuss any questions you have with your health care provider.   Document Released: 07/04/2007 Document Revised: 04/09/2011 Document Reviewed: 09/07/2010 Elsevier Interactive Patient Education 2016 Elsevier Inc.  Ankle Pain Ankle pain is a common symptom. The bones, cartilage, tendons, and muscles of the ankle joint perform a lot of work each day. The ankle joint holds your body weight and allows you to move around. Ankle pain can occur on either side or back of 1 or both ankles. Ankle pain may be sharp and burning or dull and aching. There may be tenderness, stiffness, redness, or warmth around the ankle. The pain occurs more often when a person walks or puts pressure on the ankle. CAUSES  There are many reasons ankle pain can develop. It is important to work with your caregiver to identify the cause  since many conditions can impact the bones, cartilage, muscles, and tendons. Causes for ankle pain include:  Injury, including a break (fracture), sprain, or strain often due to a fall, sports, or a high-impact activity.  Swelling (inflammation) of a tendon (tendonitis).  Achilles tendon rupture.  Ankle instability after repeated sprains and strains.  Poor foot alignment.  Pressure on a nerve (tarsal tunnel syndrome).  Arthritis in the ankle or the lining of the ankle.  Crystal formation in the ankle (gout or pseudogout). DIAGNOSIS  A diagnosis is based on your medical history, your symptoms, results of your physical exam, and results of diagnostic tests. Diagnostic tests may include X-ray exams or a computerized magnetic scan (magnetic resonance imaging, MRI). TREATMENT  Treatment will depend on the cause of your ankle pain and may include:  Keeping pressure off the ankle and limiting activities.  Using crutches or other walking support (a cane or brace).  Using rest, ice, compression, and elevation.  Participating in physical therapy or home exercises.  Wearing shoe inserts or special shoes.  Losing weight.  Taking medications to reduce pain or swelling or receiving an injection.  Undergoing surgery. HOME CARE INSTRUCTIONS   Only take over-the-counter or prescription medicines for pain, discomfort, or fever as directed by your caregiver.  Put ice on the injured area.  Put ice in a plastic bag.  Place a towel between your skin  and the bag.  Leave the ice on for 15-20 minutes at a time, 03-04 times a day.  Keep your leg raised (elevated) when possible to lessen swelling.  Avoid activities that cause ankle pain.  Follow specific exercises as directed by your caregiver.  Record how often you have ankle pain, the location of the pain, and what it feels like. This information may be helpful to you and your caregiver.  Ask your caregiver about returning to work or  sports and whether you should drive.  Follow up with your caregiver for further examination, therapy, or testing as directed. SEEK MEDICAL CARE IF:   Pain or swelling continues or worsens beyond 1 week.  You have an oral temperature above 102 F (38.9 C).  You are feeling unwell or have chills.  You are having an increasingly difficult time with walking.  You have loss of sensation or other new symptoms.  You have questions or concerns. MAKE SURE YOU:   Understand these instructions.  Will watch your condition.  Will get help right away if you are not doing well or get worse.   This information is not intended to replace advice given to you by your health care provider. Make sure you discuss any questions you have with your health care provider.   Document Released: 07/05/2009 Document Revised: 04/09/2011 Document Reviewed: 08/17/2014 Elsevier Interactive Patient Education 2016 Elsevier Inc.   Knee Pain Knee pain is a common problem. It can have many causes. The pain often goes away by following your doctor's home care instructions. Treatment for ongoing pain will depend on the cause of your pain. If your knee pain continues, more tests may be needed to diagnose your condition. Tests may include X-rays or other imaging studies of your knee. HOME CARE  Take medicines only as told by your doctor.  Rest your knee and keep it raised (elevated) while you are resting.  Do not do things that cause pain or make your pain worse.  Avoid activities where both feet leave the ground at the same time, such as running, jumping rope, or doing jumping jacks.  Apply ice to the knee area:  Put ice in a plastic bag.  Place a towel between your skin and the bag.  Leave the ice on for 20 minutes, 2-3 times a day.  Ask your doctor if you should wear an elastic knee support.  Sleep with a pillow under your knee.  Lose weight if you are overweight. Being overweight can make your knee  hurt more.  Do not use any tobacco products, including cigarettes, chewing tobacco, or electronic cigarettes. If you need help quitting, ask your doctor. Smoking may slow the healing of any bone and joint problems that you may have. GET HELP IF:  Your knee pain does not stop, it changes, or it gets worse.  You have a fever along with knee pain.  Your knee gives out or locks up.  Your knee becomes more swollen. GET HELP RIGHT AWAY IF:   Your knee feels hot to the touch.  You have chest pain or trouble breathing.   This information is not intended to replace advice given to you by your health care provider. Make sure you discuss any questions you have with your health care provider.   Document Released: 04/13/2008 Document Revised: 02/05/2014 Document Reviewed: 03/18/2013 Elsevier Interactive Patient Education [employer] Elsevier Inc.

## 2015-01-06 NOTE — ED Provider Notes (Signed)
CSN: [id number]     Arrival date & time 01/06/15  1018 History   None   Nurses notes were reviewed. Chief Complaint  Patient presents with  . Knee Pain  . Follow-up   He is here for follow-up of right knee and right ankle injury. He reports that he poor to his right ankle and right knee last week while at work. He was seen by his Luisa Hart and given light duty. He's been wearing a knee brace and states the knee is still bothering him. There is some improvement he has has some swelling. Reports torquing the couple years ago while having physical therapy because the pain in the knee and had to undergo PT to get improvement. He doesn't think he torqued his knee that bad this time but does state that he has has some swelling. He did not take Motrin on a regular basis but admits that best been difficult. He's been wearing a knee brace and states his ankle goes doing overall much better. 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   (Consider location/radiation/quality/duration/timing/severity/associated sxs/prior Treatment) Patient is a 44 y.o. male presenting with knee pain. The history is provided by the patient. No language interpreter was used.  Knee Pain Location:  Ankle and knee Injury: yes   Mechanism of injury: fall   Fall:    Fall occurred:  Tripped   Point of impact:  Unable to specify   Entrapped after fall: no   Knee location:  R knee Ankle location:  R ankle Pain details:    Quality:  Aching and sharp   Radiates to:  Does not radiate   Severity:  Moderate   Onset quality:  Sudden   Duration:  1 week   Timing:  Constant   Progression:  Improving Chronicity:  New Dislocation: no   Foreign body present:  No foreign bodies Prior injury to area:  Yes Relieved by:  Ice, NSAIDs and rest Worsened by:  Nothing tried Associated symptoms: decreased ROM and swelling   Risk factors: no concern for non-accidental trauma, no frequent  fractures, no known bone disorder, no obesity and no recent illness     Past Medical History  Diagnosis Date  . Hypertension    Past Surgical History  Procedure Laterality Date  . Cholecystectomy     Family History  Problem Relation Age of Onset  . Hypertension Mother   . Heart failure Father    Social History  Substance Use Topics  . Smoking status: Current Every Day Smoker -- 1.00 packs/day    Types: Cigarettes  . Smokeless tobacco: None  . Alcohol Use: Yes     Comment: socially    Review of Systems  All other systems reviewed and are negative.   Allergies  Review of patient's allergies indicates no known allergies.  Home Medications   Prior to Admission medications   Medication Sig Start Date End Date Taking? Authorizing Provider  amLODipine (NORVASC) 10 MG tablet Take 10 mg by mouth daily.   Yes Historical Provider, MD  hydrochlorothiazide (HYDRODIURIL) 25 MG tablet Take 25 mg by mouth daily.   Yes Historical Provider, MD  loratadine (CLARITIN) 10 MG tablet Take 10 mg by mouth daily.   Yes Historical Provider, MD  magnesium 30 MG tablet Take 30 mg by mouth 2 (two) times daily.   Yes Historical Provider, MD  riboflavin (VITAMIN B-2) 100 MG TABS tablet Take 100 mg by  mouth daily.   Yes Historical Provider, MD  meloxicam (MOBIC) 15 MG tablet Take 1 tablet (15 mg total) by mouth daily. 01/06/15   Hassan RowanEugene Freshour, MD   Meds Ordered and Administered this Visit  Medications - No data to display  BP 151/86 mmHg  Pulse 96  Temp(Src) 97.5 F (36.4 C) (Tympanic)  Resp 16  Ht 5\' 8"  (1.727 m)  Wt 233 lb (105.688 kg)  BMI 35.44 kg/m2  SpO2 99% No data found.   Physical Exam  Constitutional: He is oriented to person, place, and time. He appears well-developed and well-nourished.  HENT:  Head: Normocephalic and atraumatic.  Eyes: Pupils are equal, round, and reactive to light.  Musculoskeletal: Normal range of motion. He exhibits edema and tenderness.       Right knee:  He exhibits swelling. Tenderness found.       Legs:      Feet:  Neurological: He is alert and oriented to person, place, and time.  Skin: Skin is warm and dry.  Psychiatric: He has a normal mood and affect.    ED Course  Procedures (including critical care time)  Labs Review Labs Reviewed - No data to display  Imaging Review No results found.   Visual Acuity Review  Right Eye Distance:   Left Eye Distance:   Bilateral Distance:    Right Eye Near:   Left Eye Near:    Bilateral Near:         MDM   1. Acute pain of right knee   2. Pain and swelling of ankle, right    We'll switch him from Motrin to Mobic 15 mg 1 tablet day. Will continue to have him follow-up but at [place] with Mirian Moommie Moore nurse practitioner in a week. Continue light duty and will allow her to make the decision see if he needs physical therapy. 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       Hassan RowanEugene Beougher, MD 01/06/15 [id number]

## 2016-01-30 HISTORY — PX: BACK SURGERY: SHX140

## 2016-12-18 IMAGING — CR DG KNEE COMPLETE 4+V*R*
4 series · 4 of 4 positions shown · non-contrast
Comparison: None.

CLINICAL DATA: Right knee pain after twisting injury. Initial
encounter.

EXAM:
RIGHT KNEE - COMPLETE 4+ VIEW

[knee ap]
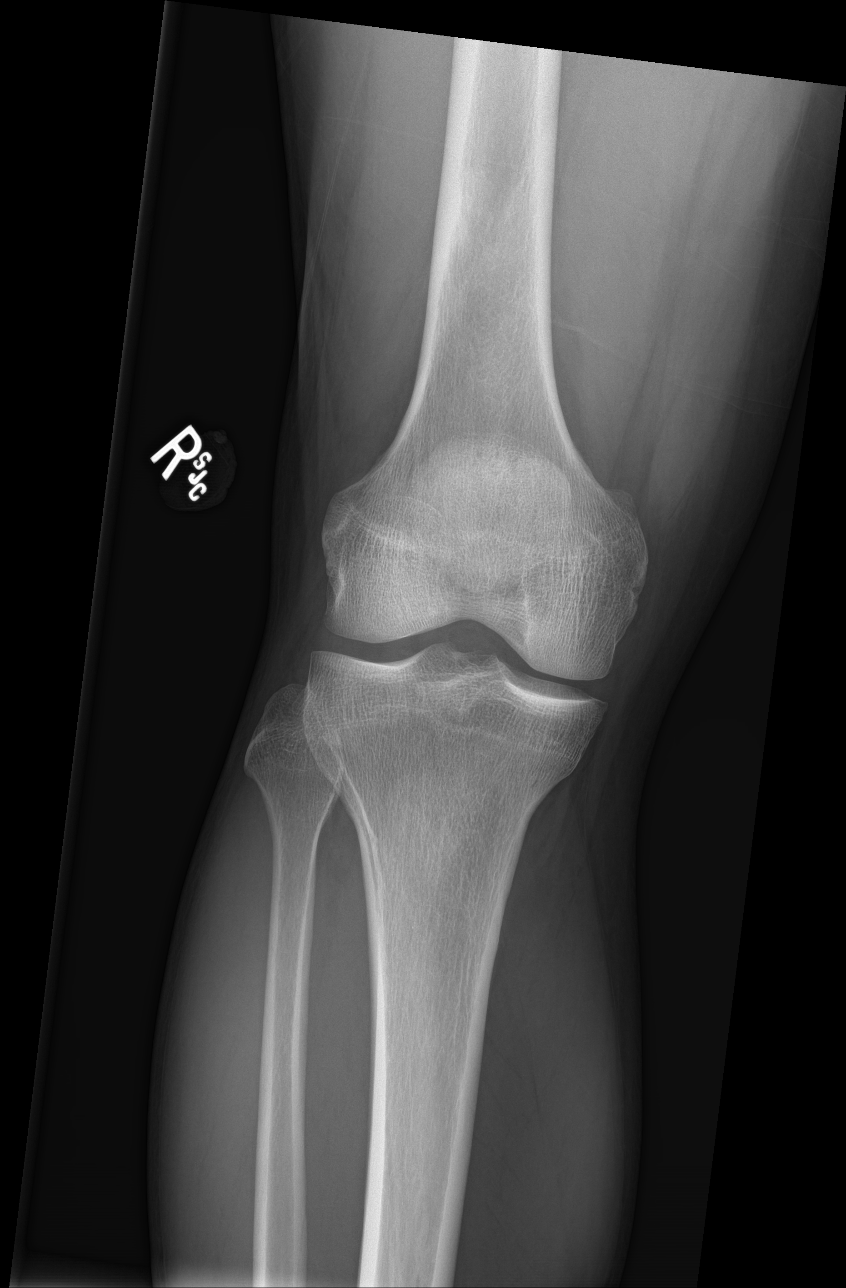

[tunnel]
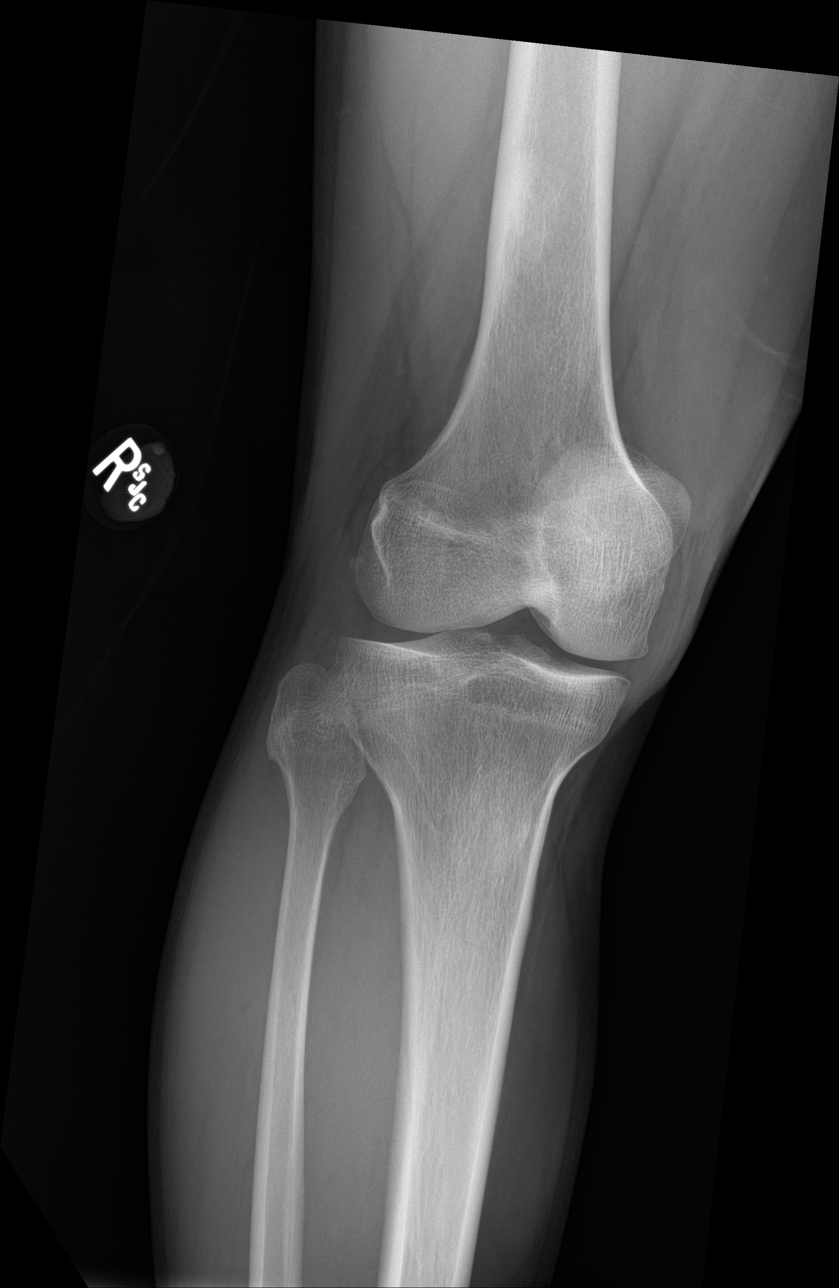

[knee lat]
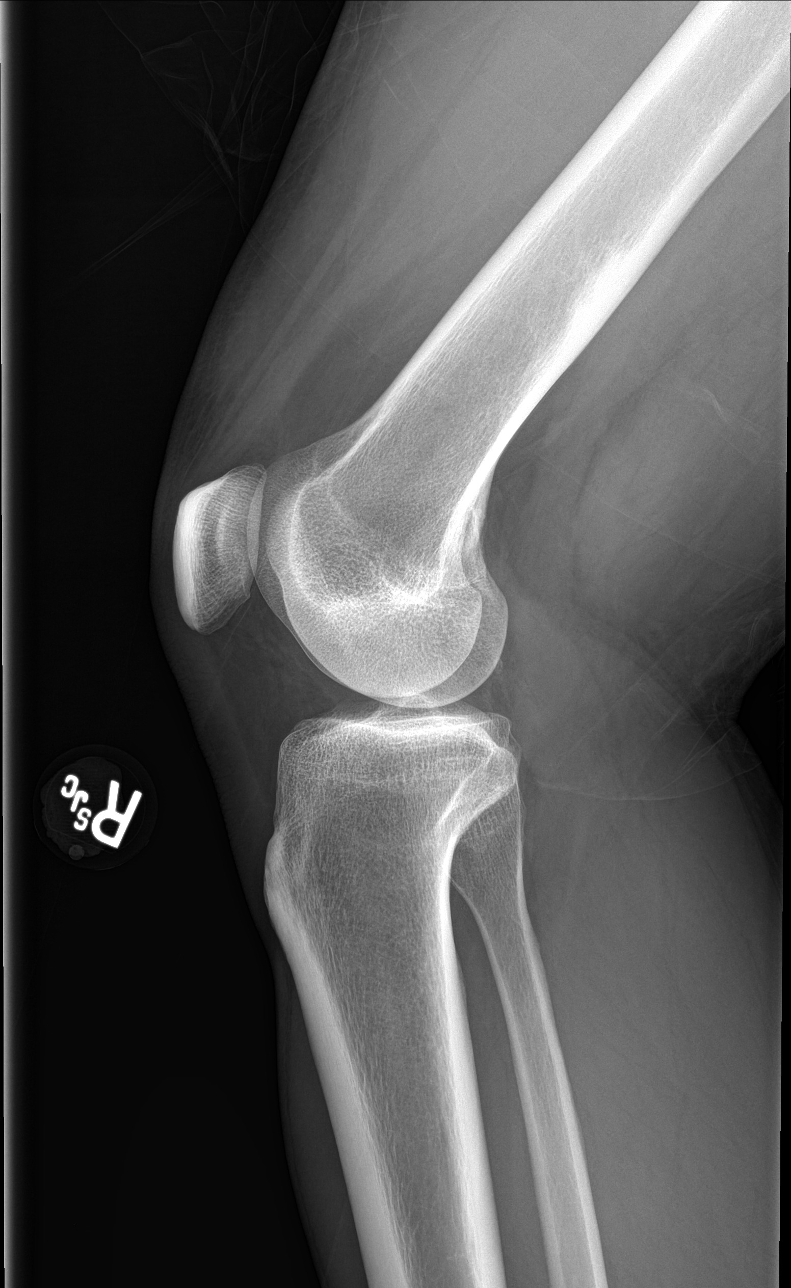

[patella skyline]
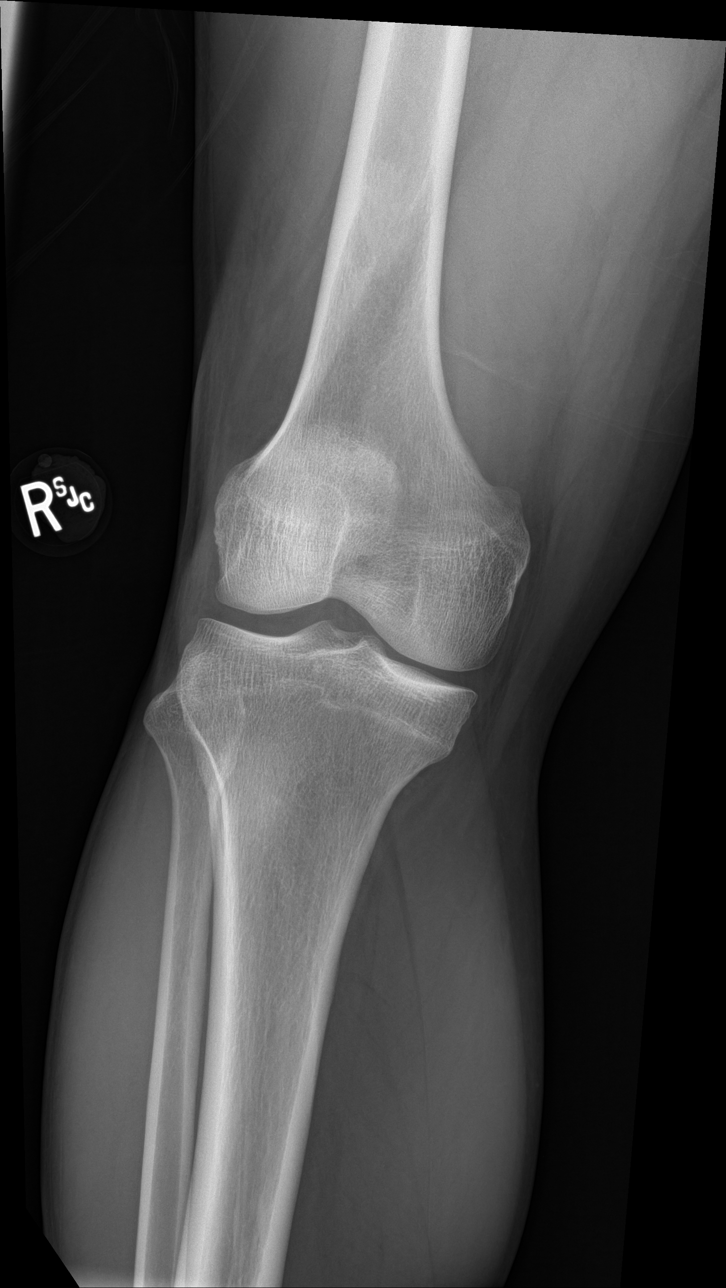

[4 of 4 positions shown; findings below may reference images not displayed]

FINDINGS: There is no evidence of fracture, dislocation, or joint effusion.
There is no evidence of arthropathy or other focal bone abnormality.
Soft tissues are unremarkable.
IMPRESSION: Normal right knee.

## 2017-04-22 ENCOUNTER — Emergency Department
Admission: EM | Admit: 2017-04-22 | Discharge: 2017-04-22 | Disposition: A | Discharge status: Home / Self Care | Payer: Self-pay | Attending: Emergency Medicine | Admitting: Emergency Medicine

## 2017-04-22 ENCOUNTER — Encounter: Payer: Self-pay | Admitting: Emergency Medicine

## 2017-04-22 ENCOUNTER — Emergency Department: Payer: Self-pay

## 2017-04-22 ENCOUNTER — Other Ambulatory Visit: Payer: Self-pay

## 2017-04-22 DIAGNOSIS — I10 Essential (primary) hypertension: Secondary | ICD-10-CM | POA: Insufficient documentation

## 2017-04-22 DIAGNOSIS — F17210 Nicotine dependence, cigarettes, uncomplicated: Secondary | ICD-10-CM | POA: Insufficient documentation

## 2017-04-22 DIAGNOSIS — Z9049 Acquired absence of other specified parts of digestive tract: Secondary | ICD-10-CM | POA: Insufficient documentation

## 2017-04-22 DIAGNOSIS — R05 Cough: Secondary | ICD-10-CM | POA: Insufficient documentation

## 2017-04-22 DIAGNOSIS — R059 Cough, unspecified: Secondary | ICD-10-CM

## 2017-04-22 DIAGNOSIS — Z79899 Other long term (current) drug therapy: Secondary | ICD-10-CM | POA: Insufficient documentation

## 2017-04-22 MED ORDER — BENZONATATE 100 MG PO CAPS: 200.0000 mg | ORAL_CAPSULE | Freq: Three times a day (TID) | ORAL | 0 refills | Status: AC | PRN

## 2017-04-22 NOTE — ED Triage Notes (Signed)
Pt to ed with c/o cough, congestion and soreness associated with cough.  Pt states has had dry cough for 2 months, but then 2 days ago started with congestion and coughing up yellow sputum.  Pt denies fever.  Appears in no acute resp distress at this time.

## 2017-04-22 NOTE — ED Provider Notes (Signed)
[facility] Emergency Department Provider Note   ____________________________________________   First MD Initiated Contact with Patient 04/22/17 1224     (approximate)  I have reviewed the triage vital signs and the nursing notes.   HISTORY  Chief Complaint Cough    HPI Joseph Stone is a 47 y.o. male patient complain of a nonproductive cough for 2 months.  Patient had 2 days ago the cough became productive.  Patient also complained of nasal congestion intermittent rhinorrhea.  Patient has a postnasal drainage.  Patient denies nausea, vomiting, diarrhea.  Patient has not taken a flu shot for this season.  No palliative measures for complaint.  Past Medical History:  Diagnosis Date  . Hypertension     There are no active problems to display for this patient.   Past Surgical History:  Procedure Laterality Date  . CHOLECYSTECTOMY      Prior to Admission medications   Medication Sig Start Date End Date Taking? Authorizing Provider  amLODipine (NORVASC) 10 MG tablet Take 10 mg by mouth daily.    [provider]  benzonatate (TESSALON PERLES) 100 MG capsule Take 2 capsules (200 mg total) by mouth 3 (three) times daily as needed. 04/22/17 04/22/18  Joni Reining, PA-C  hydrochlorothiazide (HYDRODIURIL) 25 MG tablet Take 25 mg by mouth daily.    [provider]  loratadine (CLARITIN) 10 MG tablet Take 10 mg by mouth daily.    [provider]  magnesium 30 MG tablet Take 30 mg by mouth 2 (two) times daily.    [provider]  meloxicam (MOBIC) 15 MG tablet Take 1 tablet (15 mg total) by mouth daily. 01/06/15   Hassan Rowan, MD  riboflavin (VITAMIN B-2) 100 MG TABS tablet Take 100 mg by mouth daily.    [provider]    Allergies Patient has no known allergies.  Family History  Problem Relation Age of Onset  . Hypertension Mother   . Heart failure Father     Social History Social History   Tobacco  Use  . Smoking status: Current Every Day Smoker    Packs/day: 1.00    Types: Cigarettes  . Smokeless tobacco: Never Used  Substance Use Topics  . Alcohol use: Yes    Comment: socially  . Drug use: Never    Review of Systems Constitutional: No fever/chills Eyes: No visual changes. ENT: No sore throat.  Nasal congestion Cardiovascular: Denies chest pain. Respiratory: Denies shortness of breath.  Productive cough Gastrointestinal: No abdominal pain.  No nausea, no vomiting.  No diarrhea.  No constipation. Genitourinary: Negative for dysuria. Musculoskeletal: Negative for back pain. Skin: Negative for rash. Neurological: Negative for headaches, focal weakness or numbness. Endocrine:Hypertension  ____________________________________________   PHYSICAL EXAM:  VITAL SIGNS: ED Triage Vitals [04/22/17 1220]  Enc Vitals Group     BP (!) 166/82     Pulse Rate (!) 108     Resp 18     Temp 98.2 F (36.8 C)     Temp Source Oral     SpO2 100 %     Weight 233 lb (105.7 kg)     Height      Head Circumference      Peak Flow      Pain Score 0     Pain Loc      Pain Edu?      Excl. in GC?    Constitutional: Alert and oriented. Well appearing and in no acute distress.  Nose: Edematous nasal turbinates clear rhinorrhea . mouth/Throat: Mucous membranes are moist.  Oropharynx non-erythematous. Neck: No stridor. Hematological/Lymphatic/Immunilogical: No cervical lymphadenopathy. Cardiovascular: Normal rate, regular rhythm. Grossly normal heart sounds.  Good peripheral circulation.  Elevated blood pressure Respiratory: Normal respiratory effort.  No retractions. Lungs CTAB. Neurologic:  Normal speech and language. No gross focal neurologic deficits are appreciated. No gait instability. Skin:  Skin is warm, dry and intact. No rash noted. Psychiatric: Mood and affect are normal. Speech and behavior are normal.  ____________________________________________   LABS (all labs ordered are  listed, but only abnormal results are displayed)  Labs Reviewed - No data to display ____________________________________________  EKG   ____________________________________________  RADIOLOGY  No acute findings on chest x-ray.  Official radiology report(s): Dg Chest 2 View  Result Date: 04/22/2017 CLINICAL DATA:  Nonproductive cough for 2 months. EXAM: CHEST - 2 VIEW COMPARISON:  12/22/2012 FINDINGS: The heart size and mediastinal contours are within normal limits. Both lungs are clear. The visualized skeletal structures are unremarkable. IMPRESSION: No active cardiopulmonary disease. Electronically Signed   By: Myles RosenthalJohn  Stahl M.D.   On: 04/22/2017 13:28    ____________________________________________   PROCEDURES  Procedure(s) performed: None  Procedures  Critical Care performed: No  ____________________________________________   INITIAL IMPRESSION / ASSESSMENT AND PLAN / ED COURSE  As part of my medical decision making, I reviewed the following data within the electronic MEDICAL RECORD NUMBER    Patient presents with nonproductive cough for 2 months.  Patient did not cough throughout the exam.  Discussed no acute findings on chest x-ray.  Patient given discharge care instructions and advised [facility].  Patient given a prescription for Tessalon Perles.      ____________________________________________   FINAL CLINICAL IMPRESSION(S) / ED DIAGNOSES  Final diagnoses:  Cough     ED Discharge Orders        Ordered    benzonatate (TESSALON PERLES) 100 MG capsule  3 times daily PRN     04/22/17 1342       Note:  This document was prepared using Dragon voice recognition software and may include unintentional dictation errors.    Joni ReiningSmith, Analyssa Downs K, PA-C 04/22/17 1345    Nita SickleVeronese, [place], MD 04/22/17 1540

## 2017-04-22 NOTE — ED Notes (Signed)
See triage note   Presents with cough and chest congestion   Having some discomfort in chest with cough cough is occasionally prod

## 2019-12-16 ENCOUNTER — Other Ambulatory Visit: Payer: Self-pay

## 2019-12-16 ENCOUNTER — Emergency Department
Admission: EM | Admit: 2019-12-16 | Discharge: 2019-12-16 | Disposition: A | Discharge status: Home / Self Care | Payer: Self-pay | Attending: Emergency Medicine | Admitting: Emergency Medicine

## 2019-12-16 ENCOUNTER — Emergency Department: Payer: Self-pay

## 2019-12-16 DIAGNOSIS — I10 Essential (primary) hypertension: Secondary | ICD-10-CM | POA: Insufficient documentation

## 2019-12-16 DIAGNOSIS — Z79899 Other long term (current) drug therapy: Secondary | ICD-10-CM | POA: Diagnosis not present

## 2019-12-16 DIAGNOSIS — Y9389 Activity, other specified: Secondary | ICD-10-CM | POA: Diagnosis not present

## 2019-12-16 DIAGNOSIS — M25511 Pain in right shoulder: Secondary | ICD-10-CM | POA: Insufficient documentation

## 2019-12-16 DIAGNOSIS — Y92410 Unspecified street and highway as the place of occurrence of the external cause: Secondary | ICD-10-CM | POA: Insufficient documentation

## 2019-12-16 DIAGNOSIS — M542 Cervicalgia: Secondary | ICD-10-CM | POA: Insufficient documentation

## 2019-12-16 MED ORDER — METHOCARBAMOL 500 MG PO TABS: 500.0000 mg | ORAL_TABLET | Freq: Four times a day (QID) | ORAL | 0 refills | Status: DC
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
## 2019-12-17 ENCOUNTER — Other Ambulatory Visit (HOSPITAL_COMMUNITY): Payer: Self-pay | Admitting: Chiropractic Medicine

## 2020-10-25 DIAGNOSIS — M545 Low back pain, unspecified: Secondary | ICD-10-CM | POA: Diagnosis not present

## 2020-10-25 DIAGNOSIS — M961 Postlaminectomy syndrome, not elsewhere classified: Secondary | ICD-10-CM | POA: Diagnosis not present

## 2020-11-06 DIAGNOSIS — R0981 Nasal congestion: Secondary | ICD-10-CM | POA: Diagnosis not present

## 2020-11-06 DIAGNOSIS — R0789 Other chest pain: Secondary | ICD-10-CM | POA: Diagnosis not present

## 2020-11-06 DIAGNOSIS — R059 Cough, unspecified: Secondary | ICD-10-CM | POA: Diagnosis not present

## 2020-11-06 DIAGNOSIS — J3489 Other specified disorders of nose and nasal sinuses: Secondary | ICD-10-CM | POA: Diagnosis not present

## 2020-11-06 DIAGNOSIS — R0600 Dyspnea, unspecified: Secondary | ICD-10-CM | POA: Diagnosis not present

## 2020-11-06 DIAGNOSIS — R000 Tachycardia, unspecified: Secondary | ICD-10-CM | POA: Diagnosis not present

## 2020-11-06 DIAGNOSIS — U071 COVID-19: Secondary | ICD-10-CM | POA: Diagnosis not present

## 2020-11-06 DIAGNOSIS — R69 Illness, unspecified: Secondary | ICD-10-CM | POA: Diagnosis not present

## 2020-11-06 DIAGNOSIS — I10 Essential (primary) hypertension: Secondary | ICD-10-CM | POA: Diagnosis not present

## 2020-11-06 DIAGNOSIS — R9431 Abnormal electrocardiogram [ECG] [EKG]: Secondary | ICD-10-CM | POA: Diagnosis not present

## 2020-11-06 DIAGNOSIS — D72829 Elevated white blood cell count, unspecified: Secondary | ICD-10-CM | POA: Diagnosis not present

## 2020-11-14 DIAGNOSIS — M961 Postlaminectomy syndrome, not elsewhere classified: Secondary | ICD-10-CM | POA: Diagnosis not present

## 2021-10-29 HISTORY — PX: SPINAL CORD STIMULATOR IMPLANT: SHX2422
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
## 2022-09-22 ENCOUNTER — Emergency Department
Admission: EM | Admit: 2022-09-22 | Discharge: 2022-09-22 | Disposition: A | Discharge status: Home / Self Care | Payer: Worker's Compensation | Source: Home / Self Care | Attending: Emergency Medicine | Admitting: Emergency Medicine

## 2022-09-22 ENCOUNTER — Other Ambulatory Visit: Payer: Self-pay

## 2022-09-22 DIAGNOSIS — M546 Pain in thoracic spine: Secondary | ICD-10-CM | POA: Diagnosis present

## 2022-09-22 DIAGNOSIS — G8911 Acute pain due to trauma: Secondary | ICD-10-CM | POA: Insufficient documentation

## 2022-09-22 DIAGNOSIS — X500XXA Overexertion from strenuous movement or load, initial encounter: Secondary | ICD-10-CM | POA: Insufficient documentation

## 2022-09-22 DIAGNOSIS — R200 Anesthesia of skin: Secondary | ICD-10-CM | POA: Diagnosis not present

## 2022-09-22 DIAGNOSIS — I10 Essential (primary) hypertension: Secondary | ICD-10-CM | POA: Insufficient documentation

## 2022-09-22 MED ORDER — OXYCODONE-ACETAMINOPHEN 5-325 MG PO TABS
1.0000 | ORAL_TABLET | ORAL | 0 refills | Status: AC | PRN
Start: 2022-09-22 — End: 2022-09-29

## 2022-09-22 MED ORDER — OXYCODONE-ACETAMINOPHEN 5-325 MG PO TABS
1.0000 | ORAL_TABLET | Freq: Once | ORAL | Status: AC
  Administered 2022-09-22: 1 via ORAL
  Filled 2022-09-22: qty 1

## 2022-09-22 MED ORDER — PREDNISONE 20 MG PO TABS
60.0000 mg | ORAL_TABLET | Freq: Once | ORAL | Status: AC
  Administered 2022-09-22: 60 mg via ORAL
  Filled 2022-09-22: qty 3

## 2022-09-22 NOTE — ED Provider Notes (Signed)
[facility] Provider Note    Event Date/Time   First MD Initiated Contact with Patient 09/22/22 1336     (approximate)   History   leg numbness (bilateral) and Encopresis   HPI  Joseph Stone is a 52 y.o. male with a history of hypertension and chronic back pain who presents with persistent back pain over the last 2 weeks.  The patient states that he lifted something heavy on 8/6 and since that time has had more severe pain to the bilateral mid and lower back radiating down towards his legs.  He reports paresthesias and some decreased sensation to the legs as well as paresthesias to the lower part of his torso.  He has no weakness to the legs and is able to ambulate.  He states that last night he awoke having had diarrhea, and subsequently went back to sleep and had 2 more episodes of diarrhea while sleeping.  However he had no fecal incontinence while awake.  He states that he makes some flour into water, drink it, and then the loose bowel movements have subsided.  He denies any urinary retention or incontinence.  Reviewed the past medical records.  The patient had a thoracic laminectomy for spinal cord stimulator placement in 2023 at [facility].  He had an ED visit at [facility] on 7/7 for enteritis.  [facility] currently appears to [facility]   Physical Exam   Triage Vital Signs: ED Triage Vitals  Encounter Vitals Group     BP 09/22/22 1242 (!) 159/92     Systolic BP Percentile --      Diastolic BP Percentile --      Pulse Rate 09/22/22 1242 94     Resp 09/22/22 1242 16     Temp 09/22/22 1242 97.7 F (36.5 C)     Temp Source 09/22/22 1242 Oral     SpO2 09/22/22 1242 97 %     Weight 09/22/22 1243 228 lb (103.4 kg)     Height 09/22/22 1243 5\' 8"  (1.727 m)     Head Circumference --      Peak Flow --      Pain Score 09/22/22 1252 10     Pain Loc --      Pain Education --      Exclude from Growth Chart --     Most recent vital signs: Vitals:    09/22/22 1242 09/22/22 1529  BP: (!) 159/92   Pulse: 94   Resp: 16 16  Temp: 97.7 F (36.5 C)   SpO2: 97%      General: Awake, no distress.  CV:  Good peripheral perfusion.  Resp:  Normal effort.  Abd:  No distention.  Other:  5/5 motor strength of bilateral lower extremities.  Normal gait.  Subjective mild decrease sensation to bilateral distal lower extremities.  No midline spinal tenderness.  Bilateral mild thoracic and lumbar paraspinal tenderness.  No rashes or lesions.  No step-off or crepitus.   ED Results / Procedures / Treatments   Labs (all labs ordered are listed, but only abnormal results are displayed) Labs Reviewed - No data to display   EKG   RADIOLOGY   PROCEDURES:  Critical Care performed: No  Procedures   MEDICATIONS ORDERED IN ED: Medications  oxyCODONE-acetaminophen (PERCOCET/ROXICET) 5-325 MG per tablet 1 tablet (1 tablet Oral Given 09/22/22 1417)  predniSONE (DELTASONE) tablet 60 mg (60 mg Oral Given 09/22/22 1417)     IMPRESSION /  MDM / ASSESSMENT AND PLAN / ED COURSE  I reviewed the triage vital signs and the nursing notes.  52 year old male with PMH as noted above presents with acute exacerbation of bilateral mid and lower back pain since he lifted something heavy about 2 weeks ago.  He has had paresthesias and some numbness but no weakness.  He had loose stools that he woke with last night but it does not appear that he actually had fecal incontinence.  On exam his blood pressure is elevated with otherwise normal vital signs.  He has normal motor strength bilaterally and is able to ambulate, with mild decreased sensation.  Differential diagnosis includes, but is not limited to, radiculopathy, exacerbation of chronic pain, disc herniation.  Overall suspect exacerbation of his chronic pain.  The patient does not have any motor deficits or any red flag symptoms.  There is no evidence of fracture or other bony abnormality.  I will consult  neurosurgery for further recommendations.  Patient's presentation is most consistent with severe exacerbation of chronic illness.  ----------------------------------------- 3:21 PM on 09/22/2022 -----------------------------------------  I consulted and discussed the case with Dr. Emogene Morgan from neurosurgery.  He advises that based on the described exam, with no motor deficits, able to ambulate, and no actual fecal or urinary incontinence, as well as the time course, the patient does not require emergent imaging.  He recommends pain control and close outpatient follow-up.  The patient is in agreement with this plan.  I have prescribed a course of Percocet after reviewing his record in the PDMP.  I gave strict return precautions and he expressed understanding.  He is stable for discharge at this time.  FINAL CLINICAL IMPRESSION(S) / ED DIAGNOSES   Final diagnoses:  Acute bilateral thoracic back pain     Rx / DC Orders   ED Discharge Orders          Ordered    oxyCODONE-acetaminophen (PERCOCET) 5-325 MG tablet  Every 4 hours PRN        09/22/22 1519             Note:  This document was prepared using Dragon voice recognition software and may include unintentional dictation errors.    Dionne Bucy, MD 09/22/22 [phone]

## 2022-09-22 NOTE — ED Triage Notes (Signed)
Pt to ED for back pain since 8/6 after heavy lifting. Seen at [facility] on 17th and 18th for same. States back pain has not gotten any better. Pain is lower back shooting down both legs and into testicles..  Pt states he had fecal incontinence 3 times last night while asleep, with no sensation of needing to have BM. No urinary incontinence. Also both legs felt numb. Both legs still feel numb to lateral aspect but is ambulatory with steady gait.  Pt has spinal stimulator. Pt also has L4 and L5 fusion. Took 2 800mg  ibuprophen at 1000 today.

## 2022-09-22 NOTE — Discharge Instructions (Addendum)
The Percocet as needed in addition to your other chronic medications.  You may take ibuprofen up to 600 mg every 6 hours and you can take the muscle relaxants that you have at home as well.  Make an appointment to follow-up with your spine specialist.  Return to the ER immediately for new, worsening, or persistent severe pain, any weakness in the legs, difficulty walking, urine or bowel incontinence, or any other new or worsening symptoms that concern you.

## 2023-07-23 ENCOUNTER — Other Ambulatory Visit: Payer: Self-pay

## 2023-07-23 ENCOUNTER — Observation Stay: Admitting: Anesthesiology

## 2023-07-23 ENCOUNTER — Observation Stay
Admission: EM | Admit: 2023-07-23 | Discharge: 2023-07-23 | Disposition: A | Discharge status: Home / Self Care | Attending: Gastroenterology | Admitting: Gastroenterology

## 2023-07-23 ENCOUNTER — Emergency Department

## 2023-07-23 ENCOUNTER — Encounter
Admission: EM | Disposition: A | Discharge status: Home / Self Care | Payer: Self-pay | Source: Home / Self Care | Attending: Emergency Medicine

## 2023-07-23 DIAGNOSIS — F1090 Alcohol use, unspecified, uncomplicated: Secondary | ICD-10-CM | POA: Insufficient documentation

## 2023-07-23 DIAGNOSIS — Z6836 Body mass index (BMI) 36.0-36.9, adult: Secondary | ICD-10-CM | POA: Insufficient documentation

## 2023-07-23 DIAGNOSIS — G4700 Insomnia, unspecified: Secondary | ICD-10-CM | POA: Insufficient documentation

## 2023-07-23 DIAGNOSIS — K921 Melena: Secondary | ICD-10-CM | POA: Insufficient documentation

## 2023-07-23 DIAGNOSIS — K922 Gastrointestinal hemorrhage, unspecified: Principal | ICD-10-CM

## 2023-07-23 DIAGNOSIS — F17210 Nicotine dependence, cigarettes, uncomplicated: Secondary | ICD-10-CM | POA: Diagnosis not present

## 2023-07-23 DIAGNOSIS — F1290 Cannabis use, unspecified, uncomplicated: Secondary | ICD-10-CM | POA: Diagnosis not present

## 2023-07-23 DIAGNOSIS — K253 Acute gastric ulcer without hemorrhage or perforation: Secondary | ICD-10-CM | POA: Diagnosis present

## 2023-07-23 DIAGNOSIS — E66812 Obesity, class 2: Secondary | ICD-10-CM | POA: Diagnosis not present

## 2023-07-23 DIAGNOSIS — K259 Gastric ulcer, unspecified as acute or chronic, without hemorrhage or perforation: Secondary | ICD-10-CM | POA: Diagnosis not present

## 2023-07-23 DIAGNOSIS — R7989 Other specified abnormal findings of blood chemistry: Secondary | ICD-10-CM

## 2023-07-23 DIAGNOSIS — K254 Chronic or unspecified gastric ulcer with hemorrhage: Secondary | ICD-10-CM | POA: Diagnosis not present

## 2023-07-23 DIAGNOSIS — E785 Hyperlipidemia, unspecified: Secondary | ICD-10-CM | POA: Diagnosis not present

## 2023-07-23 DIAGNOSIS — F1990 Other psychoactive substance use, unspecified, uncomplicated: Secondary | ICD-10-CM

## 2023-07-23 DIAGNOSIS — Z791 Long term (current) use of non-steroidal anti-inflammatories (NSAID): Secondary | ICD-10-CM | POA: Diagnosis not present

## 2023-07-23 DIAGNOSIS — E876 Hypokalemia: Secondary | ICD-10-CM | POA: Diagnosis not present

## 2023-07-23 DIAGNOSIS — N179 Acute kidney failure, unspecified: Secondary | ICD-10-CM | POA: Insufficient documentation

## 2023-07-23 DIAGNOSIS — G8929 Other chronic pain: Secondary | ICD-10-CM | POA: Insufficient documentation

## 2023-07-23 DIAGNOSIS — K449 Diaphragmatic hernia without obstruction or gangrene: Secondary | ICD-10-CM

## 2023-07-23 DIAGNOSIS — I10 Essential (primary) hypertension: Secondary | ICD-10-CM | POA: Insufficient documentation

## 2023-07-23 HISTORY — PX: ESOPHAGOGASTRODUODENOSCOPY: SHX5428

## 2023-07-23 HISTORY — DX: Body mass index (BMI) 36.0-36.9, adult: Z68.36

## 2023-07-23 LAB — LIPASE, BLOOD: Lipase: 32 U/L (ref 11–51)

## 2023-07-23 LAB — CBC
HCT: 45 % (ref 39.0–52.0)
HCT: 41.3 % (ref 39.0–52.0)
HCT: 41.8 % (ref 39.0–52.0)
Hemoglobin: 16.1 g/dL (ref 13.0–17.0)
Hemoglobin: 14.8 g/dL (ref 13.0–17.0)
Hemoglobin: 14.5 g/dL (ref 13.0–17.0)
MCH: 33.4 pg (ref 26.0–34.0)
MCH: 33.8 pg (ref 26.0–34.0)
MCH: 32.8 pg (ref 26.0–34.0)
MCHC: 35.8 g/dL (ref 30.0–36.0)
MCHC: 35.8 g/dL (ref 30.0–36.0)
MCHC: 34.7 g/dL (ref 30.0–36.0)
MCV: 93.4 fL (ref 80.0–100.0)
MCV: 94.3 fL (ref 80.0–100.0)
MCV: 94.6 fL (ref 80.0–100.0)
Platelets: 334 10*3/uL (ref 150–400)
Platelets: 276 10*3/uL (ref 150–400)
Platelets: 262 10*3/uL (ref 150–400)
RBC: 4.82 MIL/uL (ref 4.22–5.81)
RBC: 4.38 MIL/uL (ref 4.22–5.81)
RBC: 4.42 MIL/uL (ref 4.22–5.81)
RDW: 13.2 % (ref 11.5–15.5)
RDW: 13.2 % (ref 11.5–15.5)
RDW: 13.1 % (ref 11.5–15.5)
WBC: 20.7 10*3/uL — ABNORMAL HIGH (ref 4.0–10.5)
WBC: 15.9 10*3/uL — ABNORMAL HIGH (ref 4.0–10.5)
WBC: 14.1 10*3/uL — ABNORMAL HIGH (ref 4.0–10.5)
nRBC: 0 % (ref 0.0–0.2)
nRBC: 0 % (ref 0.0–0.2)
nRBC: 0 % (ref 0.0–0.2)

## 2023-07-23 LAB — COMPREHENSIVE METABOLIC PANEL WITH GFR
ALT: 82 U/L — ABNORMAL HIGH (ref 0–44)
AST: 44 U/L — ABNORMAL HIGH (ref 15–41)
Albumin: 4.3 g/dL (ref 3.5–5.0)
Alkaline Phosphatase: 100 U/L (ref 38–126)
Anion gap: 14 (ref 5–15)
BUN: 18 mg/dL (ref 6–20)
CO2: 24 mmol/L (ref 22–32)
Calcium: 8.7 mg/dL — ABNORMAL LOW (ref 8.9–10.3)
Chloride: 96 mmol/L — ABNORMAL LOW (ref 98–111)
Creatinine, Ser: 2.83 mg/dL — ABNORMAL HIGH (ref 0.61–1.24)
GFR, Estimated: 26 mL/min — ABNORMAL LOW (ref >60)
Glucose, Bld: 116 mg/dL — ABNORMAL HIGH (ref 70–99)
Potassium: 2.9 mmol/L — ABNORMAL LOW (ref 3.5–5.1)
Sodium: 134 mmol/L — ABNORMAL LOW (ref 135–145)
Total Bilirubin: 0.6 mg/dL (ref 0.0–1.2)
Total Protein: 7.6 g/dL (ref 6.5–8.1)

## 2023-07-23 LAB — URINALYSIS, ROUTINE W REFLEX MICROSCOPIC
Bilirubin Urine: NEGATIVE
Glucose, UA: NEGATIVE mg/dL
Ketones, ur: NEGATIVE mg/dL
Leukocytes,Ua: NEGATIVE
Nitrite: NEGATIVE
Protein, ur: 30 mg/dL — AB
RBC / HPF: 0 RBC/hpf (ref 0–5)
Specific Gravity, Urine: 1.005 (ref 1.005–1.030)
pH: 5 (ref 5.0–8.0)

## 2023-07-23 LAB — BASIC METABOLIC PANEL WITH GFR
Anion gap: 11 (ref 5–15)
BUN: 21 mg/dL — ABNORMAL HIGH (ref 6–20)
CO2: 23 mmol/L (ref 22–32)
Calcium: 8.5 mg/dL — ABNORMAL LOW (ref 8.9–10.3)
Chloride: 101 mmol/L (ref 98–111)
Creatinine, Ser: 3.86 mg/dL — ABNORMAL HIGH (ref 0.61–1.24)
GFR, Estimated: 18 mL/min — ABNORMAL LOW (ref >60)
Glucose, Bld: 97 mg/dL (ref 70–99)
Potassium: 3.7 mmol/L (ref 3.5–5.1)
Sodium: 135 mmol/L (ref 135–145)

## 2023-07-23 LAB — HIV ANTIBODY (ROUTINE TESTING W REFLEX): HIV Screen 4th Generation wRfx: NONREACTIVE

## 2023-07-23 LAB — PROTIME-INR
INR: 1.1 (ref 0.8–1.2)
Prothrombin Time: 13.9 s (ref 11.4–15.2)

## 2023-07-23 LAB — POTASSIUM: Potassium: 3.4 mmol/L — ABNORMAL LOW (ref 3.5–5.1)

## 2023-07-23 SURGERY — EGD (ESOPHAGOGASTRODUODENOSCOPY)
Anesthesia: General

## 2023-07-23 MED ORDER — PROPOFOL 500 MG/50ML IV EMUL
INTRAVENOUS | Status: DC | PRN
  Administered 2023-07-23: 175 ug/kg/min via INTRAVENOUS

## 2023-07-23 MED ORDER — MORPHINE SULFATE (PF) 4 MG/ML IV SOLN
4.0000 mg | Freq: Once | INTRAVENOUS | Status: AC
  Administered 2023-07-23: 4 mg via INTRAVENOUS
  Filled 2023-07-23: qty 1

## 2023-07-23 MED ORDER — ALBUTEROL SULFATE (2.5 MG/3ML) 0.083% IN NEBU: 3.0000 mL | INHALATION_SOLUTION | RESPIRATORY_TRACT | Status: DC | PRN

## 2023-07-23 MED ORDER — POTASSIUM CHLORIDE 10 MEQ/100ML IV SOLN
10.0000 meq | INTRAVENOUS | Status: AC
  Administered 2023-07-23 (×4): 10 meq via INTRAVENOUS
  Filled 2023-07-23 (×3): qty 100

## 2023-07-23 MED ORDER — SODIUM CHLORIDE 0.9 % IV BOLUS (SEPSIS)
1000.0000 mL | Freq: Once | INTRAVENOUS | Status: AC
  Administered 2023-07-23: 1000 mL via INTRAVENOUS

## 2023-07-23 MED ORDER — ONDANSETRON HCL 4 MG/2ML IJ SOLN: 4.0000 mg | Freq: Four times a day (QID) | INTRAMUSCULAR | Status: DC | PRN

## 2023-07-23 MED ORDER — ONDANSETRON HCL 4 MG PO TABS: 4.0000 mg | ORAL_TABLET | Freq: Four times a day (QID) | ORAL | Status: DC | PRN

## 2023-07-23 MED ORDER — SODIUM CHLORIDE 0.9 % IV SOLN: INTRAVENOUS | Status: DC

## 2023-07-23 MED ORDER — LORATADINE 10 MG PO TABS
10.0000 mg | ORAL_TABLET | Freq: Every day | ORAL | Status: DC
  Administered 2023-07-23: 10 mg via ORAL
  Filled 2023-07-23: qty 1

## 2023-07-23 MED ORDER — ACETAMINOPHEN 650 MG RE SUPP: 650.0000 mg | Freq: Four times a day (QID) | RECTAL | Status: DC | PRN

## 2023-07-23 MED ORDER — TRAZODONE HCL 50 MG PO TABS: 50.0000 mg | ORAL_TABLET | Freq: Every day | ORAL | Status: DC

## 2023-07-23 MED ORDER — METHOCARBAMOL 500 MG PO TABS
500.0000 mg | ORAL_TABLET | Freq: Four times a day (QID) | ORAL | Status: DC
  Administered 2023-07-23: 500 mg via ORAL
  Filled 2023-07-23: qty 1

## 2023-07-23 MED ORDER — MORPHINE SULFATE (PF) 2 MG/ML IV SOLN: 2.0000 mg | INTRAVENOUS | Status: DC | PRN

## 2023-07-23 MED ORDER — ACETAMINOPHEN 325 MG PO TABS
650.0000 mg | ORAL_TABLET | Freq: Four times a day (QID) | ORAL | Status: DC | PRN
Start: 2023-07-23 — End: 2023-07-23

## 2023-07-23 MED ORDER — AMLODIPINE BESYLATE 10 MG PO TABS
10.0000 mg | ORAL_TABLET | Freq: Every day | ORAL | Status: DC
  Administered 2023-07-23: 10 mg via ORAL
  Filled 2023-07-23: qty 2

## 2023-07-23 MED ORDER — PANTOPRAZOLE SODIUM 40 MG IV SOLR
80.0000 mg | Freq: Once | INTRAVENOUS | Status: AC
  Administered 2023-07-23: 80 mg via INTRAVENOUS
  Filled 2023-07-23: qty 20

## 2023-07-23 MED ORDER — PROPOFOL 10 MG/ML IV BOLUS
INTRAVENOUS | Status: DC | PRN
  Administered 2023-07-23 (×2): 20 mg via INTRAVENOUS
  Administered 2023-07-23: 130 mg via INTRAVENOUS

## 2023-07-23 MED ORDER — HYDRALAZINE HCL 20 MG/ML IJ SOLN: 5.0000 mg | INTRAMUSCULAR | Status: DC | PRN

## 2023-07-23 MED ORDER — ONDANSETRON HCL 4 MG/2ML IJ SOLN
4.0000 mg | Freq: Once | INTRAMUSCULAR | Status: AC
  Administered 2023-07-23: 4 mg via INTRAVENOUS
  Filled 2023-07-23: qty 2

## 2023-07-23 MED ORDER — LIDOCAINE HCL (CARDIAC) PF 100 MG/5ML IV SOSY
PREFILLED_SYRINGE | INTRAVENOUS | Status: DC | PRN
  Administered 2023-07-23: 50 mg via INTRAVENOUS

## 2023-07-23 NOTE — Discharge Summary (Signed)
 [facility] Discharge Summary   Patient: Joseph Stone   PCP: Tobie Border, MD DOB: 02-May-1970   Date of admission: 07/23/2023   Date of discharge: 07/23/2023    Discharge Diagnoses:   Principal Problem:   Acute gastric ulcer without hemorrhage or perforation Active Problems:   Class 2 obesity due to excess calories with body mass index (BMI) of 36.0 to 36.9 in adult   Hypertension   Dyslipidemia   Discharge Condition: patient left AMA  History of present illness: As per the H and P dictated on admission,  Joseph Stone is a 53 y.o. male with medical history significant of HTN, HLD, and class 2 obesity who presented on 6/24 with tarry stools.  He reports that he has been vomiting on and off with worsening abdominal pain.  He has been seeing thick dark blood in his stools, thought it was from drinking Koolaid.  He has a little string of blood in mucus this AM, previously without blood and just clear water.  Last emesis was about 0200.  He has been taking a lot of BC powders the last 3 weeks due to pain in his R eye, almost 2 boxes over the last 2 weeks.  He had an appointment about his eye today.  He felt dizzy when he got out of the shower once recently, otherwise none.  Also with mild SOB a day or two ago.  He has noticed pressure in his R eye, like someone is pushing on it with watering.  He had an appt at [facility] today for this.  Never told been told he has glaucoma, last saw eye doctor in March 2025.  Still having abdominal pain.    Hospital Course:  He underwent EGD with small non-bleeding acute gastric ulcer.  Renal function remained elevated and so patient was encouraged to remain hospitalized.  Likely ATN from excessive use of NSAIDs and headache powders.  Patient refused to remain hospitalized and insisted on leaving AMA.   Summary of his active problems in the hospital is as following. Principal Problem:   Acute gastric ulcer without  hemorrhage or perforation Active Problems:   Class 2 obesity due to excess calories with body mass index (BMI) of 36.0 to 36.9 in adult   Hypertension   Dyslipidemia    patient left AMA  Procedures and Results: EGD   Consultations: GI  The results of significant diagnostics from this hospitalization (including imaging, microbiology, ancillary and laboratory) are listed below for reference.    Significant Diagnostic Studies: CT ABDOMEN PELVIS WO CONTRAST Result Date: 07/23/2023 CLINICAL DATA:  Abdominal pain, acute, nonlocalized with dark stools. EXAM: CT ABDOMEN AND PELVIS WITHOUT CONTRAST TECHNIQUE: Multidetector CT imaging of the abdomen and pelvis was performed following the standard protocol without IV contrast. RADIATION DOSE REDUCTION: This exam was performed according to the departmental dose-optimization program which includes automated exposure control, adjustment of the mA and/or kV according to patient size and/or use of iterative reconstruction technique. 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 There is eventration and mild elevation of the anterior right hemidiaphragm. No lung base infiltrate is seen.  The cardiac size is normal. Hepatobiliary: The liver is 20 cm length and mildly steatotic. No masses seen without contrast. Gallbladder is absent, as before without biliary dilatation. Pancreas: Unremarkable without contrast. Spleen: Unremarkable without contrast. Adrenals/Urinary Tract: There is  no adrenal mass, no contour deforming abnormality of the unenhanced kidneys and no urinary stone or obstruction. The bladder is unremarkable for the degree of distention. Stomach/Bowel: No dilatation or wall thickening, including the appendix. There is scattered diverticulosis with no evidence of diverticulitis. Vascular/Lymphatic: Aortic atherosclerosis. No  enlarged abdominal or pelvic lymph nodes. Reproductive: Prostate is unremarkable. Other: No abdominal wall hernia or abnormality. No abdominopelvic ascites. Musculoskeletal: Left flank implanted power source with spinal cord stimulator wiring entering the spinal canal at T10-11. Interval dorsal fusion L5-S1 with interbody metal hardware and solid arthrodesis. There are degenerative changes of the spine. No acute or other significant osseous findings. IMPRESSION: 1. No acute noncontrast CT findings in the abdomen or pelvis. 2. Diverticulosis without evidence of diverticulitis. 3. Aortic atherosclerosis. 4. Mild hepatomegaly and hepatic steatosis. 5. 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 6. Spinal cord stimulator and interval L5-S1 fusion with solid arthrodesis. Aortic Atherosclerosis (ICD10-I70.0). Electronically Signed   By: Francis Quam M.D.   On: 07/23/2023 07:01    Microbiology: No results found for this or any previous visit (from the past 240 hours).   Labs: CBC: Recent Labs  Lab 07/23/23 0227 07/23/23 0924 07/23/23 1554  WBC 20.7* 15.9* 14.1*  HGB 16.1 14.8 14.5  HCT 45.0 41.3 41.8  MCV 93.4 94.3 94.6  PLT 334 276 262   Basic Metabolic Panel: Recent Labs  Lab 07/23/23 0227 07/23/23 1139 07/23/23 1554  NA 134*  --  135  K 2.9* 3.4* 3.7  CL 96*  --  101  CO2 24  --  23  GLUCOSE 116*  --  97  BUN 18  --  21*  CREATININE 2.83*  --  3.86*  CALCIUM 8.7*  --  8.5*   Liver Function Tests: Recent Labs  Lab 07/23/23 0227  AST 44*  ALT 82*  ALKPHOS 100  BILITOT 0.6  PROT 7.6  ALBUMIN 4.3   Recent Labs  Lab 07/23/23 0227  LIPASE 32   No results for input(s): AMMONIA in the last 168 hours. Cardiac Enzymes: No results for input(s): CKTOTAL, CKMB, CKMBINDEX, TROPONINI in the last 168 hours. BNP (last 3 results) No results for input(s): BNP in the last 8760 hours. CBG: No results for input(s): GLUCAP in the  last 168 hours. Time spent: <30 minutes  Signed:  Delon Herald  [facility] 07/23/2023, 5:26 PM

## 2023-07-23 NOTE — Op Note (Signed)
 [facility] Gastroenterology Patient Name: Shaiden Aldous Procedure Date: 07/23/2023 12:27 PM MRN: [id number] Account #: [id number] Date of Birth: 1970-10-24 Admit Type: Inpatient Age: 53 Room: [facility] ENDO ROOM 4 Gender: Male Note Status: Finalized Instrument Name: Upper Endoscope [id number] Procedure:             Upper GI endoscopy Indications:           Melena Providers:             Rogelia Copping MD, MD Referring MD:          No Local Md, MD (Referring MD) Medicines:             Propofol per Anesthesia Complications:         No immediate complications. Procedure:             Pre-Anesthesia Assessment:                        - Prior to the procedure, a History and Physical was                         performed, and patient medications and allergies were                         reviewed. The patient's tolerance of previous                         anesthesia was also reviewed. The risks and benefits                         of the procedure and the sedation options and risks                         were discussed with the patient. All questions were                         answered, and informed consent was obtained. Prior                         Anticoagulants: The patient has taken no anticoagulant                         or antiplatelet agents. ASA Grade Assessment: II - A                         patient with mild systemic disease. After reviewing                         the risks and benefits, the patient was deemed in                         satisfactory condition to undergo the procedure.                        After obtaining informed consent, the endoscope was                         passed under direct vision. Throughout the procedure,  the patient's blood pressure, pulse, and oxygen                         saturations were monitored continuously. The Endoscope                         was introduced through the mouth, and advanced to the                          second part of duodenum. The upper GI endoscopy was                         accomplished without difficulty. The patient tolerated                         the procedure well. Findings:      A small hiatal hernia was present.      One non-bleeding cratered gastric ulcer with a clean ulcer base (Forrest       Class III) was found at the pylorus.      The examined duodenum was normal. Impression:            - Small hiatal hernia.                        - Non-bleeding gastric ulcer with a clean ulcer base                         (Forrest Class III).                        - Normal examined duodenum.                        - No specimens collected. Recommendation:        - Return patient to hospital ward for ongoing care.                        - Resume previous diet.                        - Continue present medications.                        - No aspirin, ibuprofen, naproxen, or other                         non-steroidal anti-inflammatory drugs. Procedure Code(s):     --- Professional ---                        [phone], Esophagogastroduodenoscopy, flexible,                         transoral; diagnostic, including collection of                         specimen(s) by brushing or washing, when performed                         (separate procedure) Diagnosis Code(s):     --- Professional ---  K92.1, Melena (includes Hematochezia)                        K25.9, Gastric ulcer, unspecified as acute or chronic,                         without hemorrhage or perforation CPT copyright 2022 American Medical Association. All rights reserved. The codes documented in this report are preliminary and upon coder review may  be revised to meet current compliance requirements. Rogelia Copping MD, MD 07/23/2023 12:51:13 PM This report has been signed electronically. Number of Addenda: 0 Note Initiated On: 07/23/2023 12:27 PM Estimated Blood Loss:  Estimated blood loss:  none.      [facility]

## 2023-07-23 NOTE — Plan of Care (Signed)

## 2023-07-23 NOTE — Progress Notes (Signed)
 Pt has arrived to unit, room 130A. Pt is A&Ox4. Pt reports having abdominal; bilateral flank pain, level 5. Pt is currently NPO d/t scheduled EGD.  Resident ambulates independently. Transport has arrived to take Pt for procedure. Pt is stable at this time.

## 2023-07-23 NOTE — ED Triage Notes (Signed)
 Pt reports intermittent abd pain for the past 2 weeks, pt also noted his stools to be dark and sticky. States he noticed some redness in his stool but thought it to be kool aid that he had been drinking all day today.

## 2023-07-23 NOTE — Anesthesia Postprocedure Evaluation (Signed)
 Anesthesia Post Note  Patient: Joseph Stone  Procedure(s) Performed: EGD (ESOPHAGOGASTRODUODENOSCOPY)  Patient location during evaluation: PACU Anesthesia Type: General Level of consciousness: awake and alert Pain management: pain level controlled Vital Signs Assessment: post-procedure vital signs reviewed and stable Respiratory status: spontaneous breathing, nonlabored ventilation and respiratory function stable Cardiovascular status: blood pressure returned to baseline and stable Postop Assessment: no apparent nausea or vomiting Anesthetic complications: no   No notable events documented.   Last Vitals:  Vitals:   07/23/23 1308 07/23/23 1320  BP: (!) 128/52 129/72  Pulse: 93 97  Resp: (!) 28 14  Temp: 36.7 C   SpO2: 98% 100%    Last Pain:  Vitals:   07/23/23 1320  TempSrc:   PainSc: 0-No pain                 Camellia Merilee Louder

## 2023-07-23 NOTE — Progress Notes (Signed)
 Pt returned from EGD procedure stating that the MD stated once he completed procedure, he was going to be discharged.  Writer explained that because he was under anesthesia, still need IV fluids d/t AKI, will need to have additional lab work.  Pt insisted that he can take medication at home, that he was not getting the answers he wanted about his condition.  Writer notified Dr. Jinny & Dr. Barbarann about this matter.  Dr. Jinny immediately met with Pt, explained in detail the need to stay overnight for continued monitoring of kidney function and continued care.  Pt reluctantly agreed to have Labs drawn to determine kidney function/status. After having labs drawn, Dr. Barbarann informed writer, Pt's creatinine levels were increased and advised Pt to remain overnight for continued monitor. Pt started stating that the bed was uncomfortable. Writer adjusted bed to make Pt more comfortable.  Pt continued to insist that he can go to an outpatient clinic, get fluids for a shorter period of time and will be back to normal. Writer again explained the difference in treatment options, Pt then stated he would like his IV stopped and removed and is willing to leave AMA. Writer made Dr. Barbarann are of Pt's decision. Pt signed AMA form in the presence of writer at 5:30 pm.

## 2023-07-23 NOTE — Progress Notes (Signed)
 This patient had upper endoscopy today with a small hiatal hernia and a pyloric channel ulcer that was also small.  The patient should avoid NSAIDs.  The ulcer was a clean-based ulcer with no visible vessel with a low likelihood of bleeding.  The patient should be treated with a PPI.  Nothing further to do from a GI point of view.  I will sign off.  Please call if any further GI concerns or questions.  We would like to thank you for the opportunity to participate in the care of Joseph Stone.

## 2023-07-23 NOTE — Anesthesia Preprocedure Evaluation (Signed)
 Anesthesia Evaluation  Patient identified by MRN, date of birth, ID band Patient awake    Reviewed: Allergy & Precautions, H&P , NPO status , Patient's Chart, lab work & pertinent test results  Airway Mallampati: II  TM Distance: >3 FB Neck ROM: full    Dental no notable dental hx.    Pulmonary Current Smoker and Patient abstained from smoking.   Pulmonary exam normal        Cardiovascular hypertension, Normal cardiovascular exam     Neuro/Psych SPINAL CORD STIMULATOR IMPLANT  negative psych ROS   GI/Hepatic negative GI ROS,,,  Endo/Other  negative endocrine ROS    Renal/GU negative Renal ROS  negative genitourinary   Musculoskeletal   Abdominal   Peds  Hematology negative hematology ROS (+)   Anesthesia Other Findings Melena Hypokalemia- replete   Past Medical History: No date: Class 2 obesity due to excess calories with body mass index  (BMI) of 36.0 to 36.9 in adult 07/23/2023: Dyslipidemia No date: Hypertension  Past Surgical History: 2018: BACK SURGERY     Comment:  L4 L5 fusion No date: CHOLECYSTECTOMY 10/2021: SPINAL CORD STIMULATOR IMPLANT     Comment:  megatronic  BMI    Body Mass Index: 36.49 kg/m      Reproductive/Obstetrics negative OB ROS                              Anesthesia Physical Anesthesia Plan  ASA: 2  Anesthesia Plan: General   Post-op Pain Management:    Induction: Intravenous  PONV Risk Score and Plan: Propofol infusion and TIVA  Airway Management Planned: Natural Airway  Additional Equipment:   Intra-op Plan:   Post-operative Plan:   Informed Consent: I have reviewed the patients History and Physical, chart, labs and discussed the procedure including the risks, benefits and alternatives for the proposed anesthesia with the patient or authorized representative who has indicated his/her understanding and acceptance.     Dental  Advisory Given  Plan Discussed with: CRNA and Surgeon  Anesthesia Plan Comments:          Anesthesia Quick Evaluation

## 2023-07-23 NOTE — Consult Note (Signed)
 Rogelia Copping, MD [facility]  [address], [address] [place], [place] [address] Phone: [phone] Fax : [phone]  Consultation  Referring Provider:     Dr. Barbarann Primary Care Physician:  Tobie Border, MD Primary Gastroenterologist: [place]         Reason for Consultation:     Melena  Date of Admission:  07/23/2023 Date of Consultation:  07/23/2023         HPI:   Joseph Stone is a 53 y.o. male who was admitted with a report of melena for the last 1 to 2 weeks.  The patient has had abdominal pain that is not tender to palpation but states that it is all over his abdomen and going into his sides.  The patient has a history of hypertension and hyperlipidemia.  He has been having dark stools with little strings of blood and mucus.  He also reported that he was having some vomiting the patient was also noted to have an increase in his creatinine to 2.83 with a normal BUN.  The patient's AST was also elevated at 44 with an ALT of 82.  The patient's white cell count was elevated at 20.7 on admission is down to 15.9 today and his hemoglobin on admission was 16.1 and it has come down to 14.8 after hydration.  The patient denies ever having an EGD or colonoscopy in the past or seeing GI previously.  The patient had a CT scan of the abdomen done today that showed:  IMPRESSION: 1. No acute noncontrast CT findings in the abdomen or pelvis. 2. Diverticulosis without evidence of diverticulitis. 3. Aortic atherosclerosis. 4. Mild hepatomegaly and hepatic steatosis. 5. 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 6. Spinal cord stimulator and interval L5-S1 fusion with solid arthrodesis.  Aortic Atherosclerosis  The patient was also noted to be heme positive.  I am now being asked to see the patient for 2 weeks of melena and possible GI bleed from [place] powders.  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 Authorizing Provider  albuterol (VENTOLIN HFA) 108 (90 Base) MCG/ACT inhaler Inhale 2 puffs into the lungs every 4 (four) hours as needed for wheezing or shortness of breath. 04/08/23  Yes [provider]  amLODipine (NORVASC) 10 MG tablet Take 10 mg by mouth daily.   Yes [provider]  Aspirin-Salicylamide-Caffeine (BC HEADACHE POWDER PO) Take 1 packet by mouth daily as needed.   Yes [provider]  ibuprofen (ADVIL) 800 MG tablet Take 800 mg by mouth every 8 (eight) hours as needed. 06/05/23  Yes [provider]  loratadine (CLARITIN) 10 MG tablet Take 10 mg by mouth daily.   Yes [provider]  losartan (COZAAR) 50 MG tablet Take 50 mg by mouth daily.   Yes [provider]  magnesium 30 MG tablet Take 30 mg by mouth 2 (two) times daily.   Yes [provider]  riboflavin (VITAMIN B-2) 100 MG TABS tablet Take 100 mg  by mouth daily.   Yes [provider]  sildenafil (VIAGRA) 50 MG tablet Take 50 mg by mouth daily as needed for erectile dysfunction. 07/30/22  Yes [provider]  traZODone (DESYREL) 50 MG tablet Take 50 mg by mouth at bedtime. 04/08/23  Yes [provider]  hydrochlorothiazide (HYDRODIURIL) 25 MG tablet Take 25 mg by mouth daily. Patient not taking: Reported on 07/23/2023    [provider]    Family History  Problem Relation Age of Onset   Hypertension Mother    Heart failure Father      Social History   Tobacco Use   Smoking status: Every Day    Current packs/day: 1.00    Types: Cigarettes   Smokeless tobacco: Never   Tobacco comments:     Smokes 1/2 ppd, 20 years  Substance Use Topics   Alcohol use: Yes    Comment: socially, 2-3 beers a month; 2 beers yesterday   Drug use: Yes    Types: Marijuana    Comment: CBD oil    Allergies as of 07/23/2023 - Review Complete 07/23/2023  Allergen Reaction Noted   Aspirin Nausea And Vomiting 10/29/2016   Hydrochlorothiazide  03/10/2012   Other Dermatitis 10/18/2021    Review of Systems:    All systems reviewed and negative except where noted in HPI.   Physical Exam:  Vital signs in last 24 hours: Temp:  [98.4 F (36.9 C)-99.2 F (37.3 C)] 98.4 F (36.9 C) (06/24 1039) Pulse Rate:  [87-105] 89 (06/24 0930) Resp:  [17-18] 17 (06/24 0930) BP: (120-141)/(65-102) 141/80 (06/24 0930) SpO2:  [95 %-100 %] 98 % (06/24 0930) Weight:  [108.9 kg] 108.9 kg (06/24 0225)   General:   Pleasant, cooperative in NAD Head:  Normocephalic and atraumatic. Eyes:   No icterus.   Conjunctiva pink. PERRLA. Ears:  Normal auditory acuity. Neck:  Supple; no masses or thyroidomegaly Lungs: Respirations even and unlabored. Lungs clear to auscultation bilaterally.   No wheezes, crackles, or rhonchi.  Heart:  Regular rate and rhythm;  Without murmur, clicks, rubs or gallops Abdomen:  Soft, nondistended, nontender. Normal bowel sounds. No appreciable masses or hepatomegaly.  No rebound or guarding.  Rectal:  Not performed. Msk:  Symmetrical without gross deformities.    Extremities:  Without edema, cyanosis or clubbing. Neurologic:  Alert and oriented x3;  grossly normal neurologically. Skin:  Intact without significant lesions or rashes. Cervical Nodes:  No significant cervical adenopathy. Psych:  Alert and cooperative. Normal affect.  LAB RESULTS: Recent Labs    07/23/23 0227 07/23/23 0924  WBC 20.7* 15.9*  HGB 16.1 14.8  HCT 45.0 41.3  PLT 334 276   BMET Recent Labs    07/23/23 0227  NA 134*  K 2.9*  CL 96*  CO2 24  GLUCOSE 116*  BUN 18  CREATININE 2.83*  CALCIUM 8.7*    LFT Recent Labs    07/23/23 0227  PROT 7.6  ALBUMIN 4.3  AST 44*  ALT 82*  ALKPHOS 100  BILITOT 0.6   PT/INR Recent Labs    07/23/23 0533  LABPROT 13.9  INR 1.1    STUDIES: CT ABDOMEN PELVIS WO CONTRAST Result Date: 07/23/2023 CLINICAL DATA:  Abdominal pain, acute, nonlocalized with dark stools. EXAM: CT ABDOMEN AND PELVIS WITHOUT CONTRAST TECHNIQUE: Multidetector CT imaging of the abdomen and pelvis was performed following the standard protocol without IV contrast. RADIATION DOSE REDUCTION: This exam was performed according to the departmental dose-optimization program which includes automated exposure  control, adjustment of the mA and/or kV according to patient size and/or use of iterative reconstruction technique. 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 There is eventration and mild elevation of the anterior right hemidiaphragm. No lung base infiltrate is seen.  The cardiac size is normal. Hepatobiliary: The liver is 20 cm length and mildly steatotic. No masses seen without contrast. Gallbladder is absent, as before without biliary dilatation. Pancreas: Unremarkable without contrast. Spleen: Unremarkable without contrast. Adrenals/Urinary Tract: There is no adrenal mass, no contour deforming abnormality of the unenhanced kidneys and no urinary stone or obstruction. The bladder is unremarkable for the degree of distention. Stomach/Bowel: No dilatation or wall thickening, including the appendix. There is scattered diverticulosis with no evidence of diverticulitis. Vascular/Lymphatic: Aortic atherosclerosis. No enlarged abdominal or pelvic lymph nodes. Reproductive: Prostate is unremarkable. Other: No abdominal wall hernia or abnormality. No abdominopelvic ascites. Musculoskeletal: Left flank implanted power source with spinal cord stimulator wiring entering  the spinal canal at T10-11. Interval dorsal fusion L5-S1 with interbody metal hardware and solid arthrodesis. There are degenerative changes of the spine. No acute or other significant osseous findings. IMPRESSION: 1. No acute noncontrast CT findings in the abdomen or pelvis. 2. Diverticulosis without evidence of diverticulitis. 3. Aortic atherosclerosis. 4. Mild hepatomegaly and hepatic steatosis. 5. 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 6. Spinal cord stimulator and interval L5-S1 fusion with solid arthrodesis. Aortic Atherosclerosis (ICD10-I70.0). Electronically Signed   By: Francis Quam M.D.   On: 07/23/2023 07:01      Impression / Plan:   Assessment: Principal Problem:   Melena Active Problems:   Class 2 obesity due to excess calories with body mass index (BMI) of 36.0 to 36.9 in adult   Hypertension   Dyslipidemia   Joseph Stone is a 53 y.o. y/o male with a hemoglobin of 16.1 that was 14.8 after hydration with 2 weeks of melena and worsening kidney function.  I am now being asked to see the patient for melena and possible peptic ulcer disease due to the patient having 2 weeks of melena.  It was also noted that the patient had heme positive stools.  Plan:  Stool occult blood test has no role in evaluation of anemia and is a test for colon cancer screening and is inappropriate to be used for evaluation of anemia, as a negative or positive test would not change evaluation.  The patient's MCV is normal as well as his hemoglobin indicating that this is unlikely a chronic GI bleed.  The patient is 53 years old and should have a screening colonoscopy as an outpatient.  The patient will be set up for an upper endoscopy today due to his history of melena.  The patient should also avoid further use of NSAIDs.  His liver enzymes are elevated and likely caused by fatty liver but a workup for abnormal liver enzymes should be instituted as an  outpatient.  Further recommendations pending the results of the upper endoscopy.  Thank you for involving me in the care of this patient.      LOS: 0 days   Rogelia Copping, MD, MD. NOLIA 07/23/2023, 11:27 AM,  Pager [phone] 7am-5pm  Check AMION for 5pm -7am coverage and on weekends   Note: This dictation was prepared with Dragon dictation along with smaller phrase technology. Any transcriptional errors that result  from this process are unintentional.

## 2023-07-23 NOTE — ED Provider Notes (Signed)
 [facility] Provider Note    Event Date/Time   First MD Initiated Contact with Patient 07/23/23 [phone]     (approximate)   History   Abdominal Pain   HPI  Joseph Stone is a 53 y.o. male with history of hypertension, hyperlipidemia, obesity, cholecystectomy who presents to the emergency department with complaints of upper abdominal pain, nausea and vomiting, black stools x 1-2 weeks.  No diarrhea.  No bright red blood per rectum.  He is not on blood thinners.  No abdominal tenderness on exam.  Has never had a GI bleed before.  Does drink alcohol occasionally but no history of regular alcohol use.  No history of colon cancer.  He does report taking Goody powders every day.  History provided by patient.    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 Authorizing Provider  amLODipine (NORVASC) 10 MG tablet Take 10 mg by mouth daily.    [provider]  hydrochlorothiazide (HYDRODIURIL) 25 MG tablet Take 25 mg by mouth daily.    [provider]  loratadine (CLARITIN) 10 MG tablet Take 10 mg by mouth daily.    [provider]  magnesium 30 MG tablet Take 30 mg by mouth 2 (two) times daily.    [provider]  meloxicam  (MOBIC ) 15 MG tablet Take 1 tablet (15 mg total) by mouth daily. 01/06/15   Gully Beagle, MD  methocarbamol  (ROBAXIN ) 500 MG tablet Take 1 tablet (500 mg total) by mouth 4 (four) times daily. 12/16/19   Alona Knee, PA-C  riboflavin (VITAMIN B-2) 100 MG TABS tablet Take 100 mg by mouth daily.    [provider]    Physical Exam   Triage Vital Signs: ED Triage Vitals  Encounter Vitals Group     BP 07/23/23 0226 (!) 120/102      Girls Systolic BP Percentile --      Girls Diastolic BP Percentile --      Boys Systolic BP Percentile --      Boys Diastolic BP Percentile --      Pulse Rate 07/23/23 0226 (!) 105     Resp 07/23/23 0226 18     Temp 07/23/23 0226 98.6 F (37 C)     Temp src --      SpO2 07/23/23 0226 100 %     Weight 07/23/23 0225 240 lb (108.9 kg)     Height 07/23/23 0225 5' 8 (1.727 m)     Head Circumference --      Peak Flow --      Pain Score 07/23/23 0225 6     Pain Loc --      Pain Education --      Exclude from Growth Chart --     Most recent vital signs: Vitals:   07/23/23 0534 07/23/23 0540  BP:  136/65  Pulse: 100 100  Resp:  18  Temp: 99.2 F (37.3 C) 99.2 F (37.3 C)  SpO2:  99%    CONSTITUTIONAL: Alert, responds appropriately to questions. Well-appearing; well-nourished HEAD: Normocephalic, atraumatic EYES: Conjunctivae clear, pupils appear equal, sclera nonicteric ENT: normal nose; moist mucous membranes NECK:  Supple, normal ROM CARD: Regular and tachycardic; S1 and S2 appreciated RESP: Normal chest excursion without splinting or tachypnea; breath sounds clear and equal bilaterally; no wheezes, no rhonchi, no rales, no hypoxia or respiratory distress, speaking full sentences ABD/GI: Non-distended; soft, non-tender, no rebound, no guarding, no peritoneal signs, no tenderness at McBurney's point RECTAL:  Normal rectal tone, no gross blood or melena but only minimal stool present, strongly guaiac positive, patient does have 2 external nonthrombosed hemorrhoids that are nonbleeding, nontender rectal exam, no fecal impaction. Chaperone present. BACK: The back appears normal EXT: Normal ROM in all joints; no deformity noted, no edema SKIN: Normal color for age and race; warm; no rash on exposed skin NEURO: Moves all extremities equally, normal speech PSYCH: The patient's mood and manner are appropriate.   ED Results / Procedures / Treatments   LABS: (all labs ordered  are listed, but only abnormal results are displayed) Labs Reviewed  COMPREHENSIVE METABOLIC PANEL WITH GFR - Abnormal; Notable for the following components:      Result Value   Sodium 134 (*)    Potassium 2.9 (*)    Chloride 96 (*)    Glucose, Bld 116 (*)    Creatinine, Ser 2.83 (*)    Calcium 8.7 (*)    AST 44 (*)    ALT 82 (*)    GFR, Estimated 26 (*)    All other components within normal limits  CBC - Abnormal; Notable for the following components:   WBC 20.7 (*)    All other components within normal limits  LIPASE, BLOOD  PROTIME-INR  URINALYSIS, ROUTINE W REFLEX MICROSCOPIC     EKG:  EKG Interpretation Date/Time:    Ventricular Rate:    PR Interval:    QRS Duration:    QT Interval:    QTC Calculation:   R Axis:      Text Interpretation:           RADIOLOGY: My personal review and interpretation of imaging:    I have personally reviewed all radiology reports.   CT ABDOMEN PELVIS WO CONTRAST Result Date: 07/23/2023 CLINICAL DATA:  Abdominal pain, acute, nonlocalized with dark stools. EXAM: CT ABDOMEN AND PELVIS WITHOUT CONTRAST TECHNIQUE: Multidetector CT imaging of the abdomen and pelvis was performed following the standard protocol without IV contrast. RADIATION DOSE REDUCTION: This exam was performed according to the departmental dose-optimization program which includes automated exposure control, adjustment of the mA and/or kV according to patient size and/or use of iterative reconstruction technique. 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 There is eventration and mild elevation of the anterior right hemidiaphragm. No lung base infiltrate is seen.  The cardiac size is normal. Hepatobiliary: The liver is 20 cm length and mildly steatotic. No masses seen without contrast. Gallbladder is absent, as before without biliary dilatation.  Pancreas: Unremarkable without contrast. Spleen: Unremarkable without contrast. Adrenals/Urinary Tract: There is no adrenal mass, no contour deforming abnormality of the unenhanced kidneys and no urinary stone or obstruction. The bladder is unremarkable for the degree of distention. Stomach/Bowel: No dilatation or wall thickening, including the appendix. There is scattered diverticulosis with no evidence of diverticulitis. Vascular/Lymphatic: Aortic atherosclerosis. No enlarged abdominal or pelvic lymph nodes. Reproductive: Prostate is unremarkable. Other: No abdominal wall hernia or abnormality. No abdominopelvic ascites. Musculoskeletal: Left flank implanted power source with spinal cord stimulator wiring entering the spinal canal  at T10-11. Interval dorsal fusion L5-S1 with interbody metal hardware and solid arthrodesis. There are degenerative changes of the spine. No acute or other significant osseous findings. IMPRESSION: 1. No acute noncontrast CT findings in the abdomen or pelvis. 2. Diverticulosis without evidence of diverticulitis. 3. Aortic atherosclerosis. 4. Mild hepatomegaly and hepatic steatosis. 5. 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 6. Spinal cord stimulator and interval L5-S1 fusion with solid arthrodesis. Aortic Atherosclerosis (ICD10-I70.0). Electronically Signed   By: Francis Quam M.D.   On: 07/23/2023 07:01     PROCEDURES:  Critical Care performed: Yes, see critical care procedure note(s)   CRITICAL CARE Performed by: Josette Jerrika Ledlow   Total critical care time: 30 minutes  Critical care time was exclusive of separately billable procedures and treating other patients.  Critical care was necessary to treat or prevent imminent or life-threatening deterioration.  Critical care was time spent personally by me on the following activities: development of treatment plan with patient and/or surrogate as well as nursing, discussions with  consultants, evaluation of patient's response to treatment, examination of patient, obtaining history from patient or surrogate, ordering and performing treatments and interventions, ordering and review of laboratory studies, ordering and review of radiographic studies, pulse oximetry and re-evaluation of patient's condition.   SABRA1-3 Lead EKG Interpretation  Performed by: Stylianos Stradling, Josette SAILOR, DO Authorized by: Sunny Gains, Josette SAILOR, DO     Interpretation: abnormal     ECG rate:  100   ECG rate assessment: tachycardic     Rhythm: sinus tachycardia     Ectopy: none     Conduction: normal       IMPRESSION / MDM / ASSESSMENT AND PLAN / ED COURSE  I reviewed the triage vital signs and the nursing notes.    Patient here with complaints of abdominal pain, vomiting, 1 to 2 weeks of black tarry stool.  Has been taking NSAIDs every day.  The patient is on the cardiac monitor to evaluate for evidence of arrhythmia and/or significant heart rate changes.   DIFFERENTIAL DIAGNOSIS (includes but not limited to):   Upper GI bleed from gastritis, peptic ulcer, doubt perforation.  Doubt pancreatitis as benign exam.  Low suspicion for appendicitis.  Differential also includes anemia, acute kidney injury.   Patient's presentation is most consistent with acute presentation with potential threat to life or bodily function.   PLAN: Patient's labs show creatinine of 2.83.  On review of records in care everywhere his creatinine was 0.81 in March 2025.  I suspect that this is from heavy recent NSAID use.  Will hydrate patient.  Will avoid any further nephrotoxic medications.  He complains of abdominal pain, 2 episodes of nonbloody, nonbilious vomiting and 2 weeks of intermittent black tarry stools.  Minimal stool on rectal exam but is strongly guaiac positive here.  I am concerned for upper GI bleed also secondary to NSAID use.  His hemoglobin is 16.  He is mildly tachycardic but no hypotension.  Normal platelets.   Will check INR.  He is not on blood thinners.  Of note his white count is 20.7 but this appears chronic on review of previous labs dating all the way back to 2014.  His abdominal exam is currently benign.  Noncontrast CT scan ordered but low suspicion for surgical pathology.  Will give pain and nausea medicine here.  Will keep NPO.   MEDICATIONS GIVEN IN ED: Medications  0.9 %  sodium chloride infusion ( Intravenous New  Bag/Given 07/23/23 0547)  potassium chloride  10 mEq in 100 mL IVPB (10 mEq Intravenous New Bag/Given 07/23/23 0637)  pantoprazole (PROTONIX) injection 80 mg (80 mg Intravenous Given 07/23/23 0550)  sodium chloride 0.9 % bolus 1,000 mL (0 mLs Intravenous Stopped 07/23/23 0723)  morphine (PF) 4 MG/ML injection 4 mg (4 mg Intravenous Given 07/23/23 0550)  ondansetron (ZOFRAN) injection 4 mg (4 mg Intravenous Given 07/23/23 0554)     ED COURSE: CT of the abdomen pelvis reviewed and interpreted by myself and the radiologist and shows no acute abnormality.   CONSULTS:  Consulted and discussed patient's case with hospitalist, Dr. Cleatus.  I have recommended admission and consulting physician agrees and will place admission orders.  Patient (and family if present) agree with this plan.   I reviewed all nursing notes, vitals, pertinent previous records.  All labs, EKGs, imaging ordered have been independently reviewed and interpreted by myself.    OUTSIDE RECORDS REVIEWED: Reviewed last family medicine note on 04/08/2023.       FINAL CLINICAL IMPRESSION(S) / ED DIAGNOSES   Final diagnoses:  Upper GI bleed  AKI (acute kidney injury) (HCC)  Excessive use of nonsteroidal anti-inflammatory drug (NSAID)  Hypokalemia     Rx / DC Orders   ED Discharge Orders     None        Note:  This document was prepared using Dragon voice recognition software and may include unintentional dictation errors.   Luis Nickles, Josette SAILOR, DO 07/23/23 [id number]

## 2023-07-23 NOTE — ED Notes (Signed)
 Patient transferred to 130A.

## 2023-07-23 NOTE — Transfer of Care (Signed)
 Immediate Anesthesia Transfer of Care Note  Patient: Joseph Stone  Procedure(s) Performed: EGD (ESOPHAGOGASTRODUODENOSCOPY)  Patient Location: Endoscopy Unit  Anesthesia Type:General  Level of Consciousness: drowsy and patient cooperative  Airway & Oxygen Therapy: Patient Spontanous Breathing and Patient connected to face mask oxygen  Post-op Assessment: Report given to RN, Post -op Vital signs reviewed and stable, and Patient moving all extremities X 4  Post vital signs: Reviewed and stable  Last Vitals:  Vitals Value Taken Time  BP 103/62 07/23/23 12:59  Temp    Pulse 102 07/23/23 12:59  Resp 29 07/23/23 12:59  SpO2 96 % 07/23/23 12:59  Vitals shown include unfiled device data.  Last Pain:  Vitals:   07/23/23 1208  TempSrc: Oral  PainSc:          Complications: No notable events documented.

## 2023-07-23 NOTE — H&P (Signed)
 History and Physical    Patient: Joseph Stone FMW:[id number] DOB: [birth year] DOA: 07/23/2023 DOS: the patient was seen and examined on 07/23/2023 PCP: Tobie Border, MD  Patient coming from: Home - lives alone; NOK: Wife (estranged), [place], [phone]   Chief Complaint: Melena  HPI: Joseph Stone is a 53 y.o. male with medical history significant of HTN, HLD, and class 2 obesity who presented on 6/24 with tarry stools.  He reports that he has been vomiting on and off with worsening abdominal pain.  He has been seeing thick dark blood in his stools, thought it was from drinking Koolaid.  He has a little string of blood in mucus this AM, previously without blood and just clear water.  Last emesis was about 0200.  He has been taking a lot of BC powders the last 3 weeks due to pain in his R eye, almost 2 boxes over the last 2 weeks.  He had an appointment about his eye today.  He felt dizzy when he got out of the shower once recently, otherwise none.  Also with mild SOB a day or two ago.  He has noticed pressure in his R eye, like someone is pushing on it with watering.  He had an appt at [facility] today for this.  Never told been told he has glaucoma, last saw eye doctor in March 2025.  Still having abdominal pain.    ER Course:  Carryover per EDP 53 year old male coming in with upper abdominal pain, melena, acute kidney injury with creatinine of 2.9. Guaiac positive stool here, no gross blood. Abdominal exam benign. No vomiting. Has been taking Goody powders every day. Getting Protonix, IV fluids. Hemoglobin normal. Not on blood thinners. Suspect AKI secondary to NSAID use, likely peptic ulcer, gastritis as well with upper GI bleed.      Review of Systems: As mentioned in the history of present illness. All other systems reviewed and are negative. 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 He has never used smokeless tobacco. He reports current alcohol use. He reports current drug use. Drug: Marijuana.  No Known Allergies  Family History  Problem Relation Age of Onset   Hypertension Mother    Heart failure Father     Prior to Admission medications   Medication Sig Start Date End Date Taking? Authorizing Provider  albuterol (VENTOLIN HFA) 108 (90 Base) MCG/ACT inhaler Inhale 2 puffs into the lungs every 4 (four) hours as needed for wheezing or shortness of breath. 04/08/23  Yes [provider]  amLODipine (NORVASC) 10 MG tablet Take 10 mg by mouth daily.   Yes [provider]  ibuprofen (ADVIL) 800 MG tablet Take 800 mg by mouth every 8 (eight) hours as needed. 06/05/23  Yes [provider]  losartan (COZAAR) 50 MG tablet Take 50 mg by mouth daily.   Yes [provider]  sildenafil (VIAGRA) 50 MG tablet Take 50 mg by mouth daily as needed for erectile dysfunction. 07/30/22  Yes [provider]  traZODone (DESYREL) 50  MG tablet Take 50 mg by mouth at bedtime. 04/08/23  Yes [provider]  hydrochlorothiazide (HYDRODIURIL) 25 MG tablet Take 25 mg by mouth daily.    [provider]  loratadine (CLARITIN) 10 MG tablet Take 10 mg by mouth daily.    [provider]  magnesium 30 MG tablet Take 30 mg by mouth 2 (two) times daily.    [provider]  meloxicam  (MOBIC ) 15 MG tablet Take 1 tablet (15 mg total) by mouth daily. 01/06/15   Desiderio Beagle, MD  methocarbamol  (ROBAXIN ) 500 MG tablet Take 1 tablet (500 mg total) by mouth 4 (four) times daily. 12/16/19   Alona Knee, PA-C  riboflavin (VITAMIN B-2) 100 MG TABS  tablet Take 100 mg by mouth daily.    [provider]    Physical Exam: Vitals:   07/23/23 0730 07/23/23 0800 07/23/23 0930 07/23/23 1039  BP:  122/75 (!) 141/80   Pulse: 93 91 89   Resp:   17   Temp:    98.4 F (36.9 C)  TempSrc:    Oral  SpO2: 99% 99% 98%   Weight:      Height:       General:  Appears calm and comfortable and is in NAD Eyes:  PERRL, EOMI, normal lids, iris ENT:  grossly normal hearing, lips & tongue, mmm Neck:  no LAD, masses or thyromegaly Cardiovascular:  RRR. No LE edema.  Respiratory:   CTA bilaterally with no wheezes/rales/rhonchi.  Normal respiratory effort. Abdomen:  soft, mild epigastric TTP, ND Skin:  no rash or induration seen on limited exam Musculoskeletal:  grossly normal tone BUE/BLE, good ROM, no bony abnormality Psychiatric:  grossly normal mood and affect, speech fluent and appropriate, AOx3 Neurologic:  CN 2-12 grossly intact, moves all extremities in coordinated fashion  -- Radiological Exams on Admission-: Independently reviewed - see discussion in A/P where applicable  CT ABDOMEN PELVIS WO CONTRAST Result Date: 07/23/2023 CLINICAL DATA:  Abdominal pain, acute, nonlocalized with dark stools. EXAM: CT ABDOMEN AND PELVIS WITHOUT CONTRAST TECHNIQUE: Multidetector CT imaging of the abdomen and pelvis was performed following the standard protocol without IV contrast. RADIATION DOSE REDUCTION: This exam was performed according to the departmental dose-optimization program which includes automated exposure control, adjustment of the mA and/or kV according to patient size and/or use of iterative reconstruction technique. 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 There is eventration and mild elevation of the anterior right hemidiaphragm. No lung base infiltrate is seen.  The cardiac size is normal. Hepatobiliary:  The liver is 20 cm length and mildly steatotic. No masses seen without contrast. Gallbladder is absent, as before without biliary dilatation. Pancreas: Unremarkable without contrast. Spleen: Unremarkable without contrast. Adrenals/Urinary Tract: There is no adrenal mass, no contour deforming abnormality of the unenhanced kidneys and no urinary stone or obstruction. The bladder is unremarkable for the degree of distention. Stomach/Bowel: No dilatation or wall thickening, including the appendix. There is scattered diverticulosis with no evidence of diverticulitis. Vascular/Lymphatic: Aortic atherosclerosis. No enlarged abdominal or pelvic lymph nodes. Reproductive: Prostate is unremarkable. Other: No abdominal wall hernia or abnormality. No abdominopelvic ascites. Musculoskeletal: Left flank implanted power source with spinal cord stimulator wiring entering the spinal canal at T10-11. Interval dorsal fusion L5-S1 with interbody metal hardware and solid arthrodesis. There are degenerative changes of the spine. No acute or other significant osseous findings.  IMPRESSION: 1. No acute noncontrast CT findings in the abdomen or pelvis. 2. Diverticulosis without evidence of diverticulitis. 3. Aortic atherosclerosis. 4. Mild hepatomegaly and hepatic steatosis. 5. 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 6. Spinal cord stimulator and interval L5-S1 fusion with solid arthrodesis. Aortic Atherosclerosis (ICD10-I70.0). Electronically Signed   By: Francis Quam M.D.   On: 07/23/2023 07:01    EKG:  None   Labs on Admission: I have personally reviewed the available labs and imaging studies at the time of the admission.  Pertinent labs:    K+ 2.9 Glucose 116 BUN 18/Creatinine 2.83/GFR 26 AST 44/ALT 82 WBC 20.7 -> 15.9 Hgb 16.1 -> 14.8 INR 1.1 UA: small Hgb   Assessment and Plan: Principal Problem:   Melena Active Problems:   Class 2 obesity due to excess calories  with body mass index (BMI) of 36.0 to 36.9 in adult   Hypertension   Dyslipidemia    Acute Upper GI Bleeding Patient presented with melena and scant hematemesis, suggestive of upper GI bleeding. He reports significant use of BC powders over the last days particularly, also has meloxicam  and 800 mg ibuprofen listed on med rec Most likely diagnosis is gastric or duodenal ulcer, esophagitis or gastritis, or Mallory-Weiss tear. The patient is no longer tachycardic with normal/elevated blood pressure, suggesting subacute volume loss.  Will observe in med surg at this time GI consulted by ED, will follow up recommendations NPO for possible EGD NS at 100 mL/hr Start IV pantoprazole 40 BID  Zofran IV for nausea Avoid NSAIDs and SQ heparin Maintain IV access (2 large bore IVs if possible). Type and screen were done in ED.  Monitor closely and follow cbc q12h, transfuse as necessary for Hbg <7   AKI Creatinine normal in 03/2013, now 2.83 Suspect hypovolemia from GI bleeding and n/v IVF Correction Avoid nephrotoxic medications  F/u BMP in AM  HTN BP 141/80 Continue amlodipine Hold hydrochlorothiazide, losartan  HLD LDL was 136 on 3/10 He does not appear to be taking medications for this issue at this time   Insomnia Continue trazodone  Chronic pain Continue methocarbamol   Class 2 obesity Body mass index is 36.49 kg/m.SABRA  Weight loss should be encouraged Outpatient PCP/bariatric medicine f/u encouraged Significantly low or high BMI is associated with higher medical risk including morbidity and mortality     Advance Care Planning:   Code Status: Full Code - Code status was discussed with the patient and/or family at the time of admission.  The patient would want to receive full resuscitative measures at this time.   Consults: GI  DVT Prophylaxis: SCDs  Family Communication: None present  Severity of Illness: The appropriate patient status for this patient is OBSERVATION.  Observation status is judged to be reasonable and necessary in order to provide the required intensity of service to ensure the patient's safety. The patient's presenting symptoms, physical exam findings, and initial radiographic and laboratory data in the context of their medical condition is felt to place them at decreased risk for further clinical deterioration. Furthermore, it is anticipated that the patient will be medically stable for discharge from the hospital within 2 midnights of admission.   Author: Delon Herald, MD 07/23/2023 10:58 AM  For on call review [email or web address].

## 2023-07-24 ENCOUNTER — Encounter: Payer: Self-pay | Admitting: Gastroenterology

## 2023-11-05 ENCOUNTER — Ambulatory Visit (INDEPENDENT_AMBULATORY_CARE_PROVIDER_SITE_OTHER): Admitting: Podiatry

## 2023-11-05 DIAGNOSIS — Z79899 Other long term (current) drug therapy: Secondary | ICD-10-CM | POA: Diagnosis not present

## 2023-11-05 DIAGNOSIS — B351 Tinea unguium: Secondary | ICD-10-CM

## 2023-11-05 NOTE — Progress Notes (Signed)
 Subjective:  Patient ID: Joseph Stone, male    DOB: 29-Sep-1970,  MRN: [id number]  Chief Complaint  Patient presents with   Nail Problem    Pt stated that he has been dealing with nail fungus for a while now he has tried several different things but nothing seems to help.     53 y.o. male presents with the above complaint.  Patient presents with bilateral hallux thickened elongated dystrophic mycotic toenails x 2.  Wanted get it evaluated 1 discussed treatment options has not tried anything for it.  Has tried some topical medication which has not helped.  Denies any other acute complaints.   Review of Systems: Negative except as noted in the HPI. Denies N/V/F/Ch.  Past Medical History:  Diagnosis Date   Class 2 obesity due to excess calories with body mass index (BMI) of 36.0 to 36.9 in adult    Dyslipidemia 07/23/2023   Hypertension     Current Outpatient Medications:    atorvastatin (LIPITOR) 10 MG tablet, Take 10 mg by mouth daily., Disp: , Rfl:    albuterol  (VENTOLIN  HFA) 108 (90 Base) MCG/ACT inhaler, Inhale 2 puffs into the lungs every 4 (four) hours as needed for wheezing or shortness of breath., Disp: , Rfl:    amLODipine  (NORVASC ) 10 MG tablet, Take 10 mg by mouth daily., Disp: , Rfl:    Aspirin-Salicylamide-Caffeine (BC HEADACHE POWDER PO), Take 1 packet by mouth daily as needed., Disp: , Rfl:    hydrochlorothiazide (HYDRODIURIL) 25 MG tablet, Take 25 mg by mouth daily. (Patient not taking: Reported on 07/23/2023), Disp: , Rfl:    ibuprofen (ADVIL) 800 MG tablet, Take 800 mg by mouth every 8 (eight) hours as needed., Disp: , Rfl:    loratadine  (CLARITIN ) 10 MG tablet, Take 10 mg by mouth daily., Disp: , Rfl:    losartan (COZAAR) 50 MG tablet, Take 50 mg by mouth daily., Disp: , Rfl:    magnesium 30 MG tablet, Take 30 mg by mouth 2 (two) times daily., Disp: , Rfl:    riboflavin (VITAMIN B-2) 100 MG TABS tablet, Take 100 mg by mouth daily., Disp: , Rfl:    sildenafil (VIAGRA)  50 MG tablet, Take 50 mg by mouth daily as needed for erectile dysfunction., Disp: , Rfl:    traZODone  (DESYREL ) 50 MG tablet, Take 50 mg by mouth at bedtime., Disp: , Rfl:   Social History   Tobacco Use  Smoking Status Every Day   Current packs/day: 1.00   Types: Cigarettes  Smokeless Tobacco Never  Tobacco Comments   Smokes 1/2 ppd, 20 years    Allergies  Allergen Reactions   Aspirin Nausea And Vomiting   Hydrochlorothiazide    Other Dermatitis   Objective:  There were no vitals filed for this visit. There is no height or weight on file to calculate BMI. Constitutional Well developed. Well nourished.  Vascular Dorsalis pedis pulses palpable bilaterally. Posterior tibial pulses palpable bilaterally. Capillary refill normal to all digits.  No cyanosis or clubbing noted. Pedal hair growth normal.  Neurologic Normal speech. Oriented to person, place, and time. Epicritic sensation to light touch grossly present bilaterally.  Dermatologic Nails thickened longer dystrophic mycotic toenails x 2 Skin within normal limits  Orthopedic: Normal joint ROM without pain or crepitus bilaterally. No visible deformities. No bony tenderness.   Radiographs: None Assessment:   1. Long-term use of high-risk medication   2. Nail fungus   3. Onychomycosis due to dermatophyte    Plan:  Patient was evaluated and treated and all questions answered.  Bilateral hallux onychomycosis -Educated the patient on the etiology of onychomycosis and various treatment options associated with improving the fungal load.  I explained to the patient that there is 3 treatment options available to treat the onychomycosis including topical, p.o., laser treatment.  Patient elected to undergo p.o. options with Lamisil/terbinafine therapy.  In order for me to start the medication therapy, I explained to the patient the importance of evaluating the liver and obtaining the liver function test.  Once the liver  function test comes back normal I will start him on 26-month course of Lamisil therapy.  Patient understood all risk and would like to proceed with Lamisil therapy.  I have asked the patient to immediately stop the Lamisil therapy if she has any reactions to it and call the office or go to the emergency room right away.  Patient states understanding   No follow-ups on file.

## 2023-11-06 LAB — HEPATIC FUNCTION PANEL
ALT: 26 IU/L (ref 0–44)
AST: 17 IU/L (ref 0–40)
Albumin: 4.3 g/dL (ref 3.8–4.9)
Alkaline Phosphatase: 109 IU/L (ref 47–123)
Bilirubin Total: 0.4 mg/dL (ref 0.0–1.2)
Bilirubin, Direct: 0.13 mg/dL (ref 0.00–0.40)
Total Protein: 6.9 g/dL (ref 6.0–8.5)

## 2023-11-06 MED ORDER — TERBINAFINE HCL 250 MG PO TABS: 250.0000 mg | ORAL_TABLET | Freq: Every day | ORAL | 0 refills | Status: AC

## 2023-11-06 NOTE — Addendum Note (Signed)
 Addended by: Latressa Harries on: 11/06/2023 12:49 PM   Modules accepted: Orders

## 2023-12-31 ENCOUNTER — Other Ambulatory Visit: Payer: Self-pay | Admitting: Neurosurgery

## 2023-12-31 DIAGNOSIS — M961 Postlaminectomy syndrome, not elsewhere classified: Secondary | ICD-10-CM

## 2024-01-01 ENCOUNTER — Ambulatory Visit
Admission: RE | Admit: 2024-01-01 | Discharge: 2024-01-01 | Disposition: A | Source: Ambulatory Visit | Attending: Neurosurgery | Admitting: Neurosurgery

## 2024-01-01 DIAGNOSIS — M961 Postlaminectomy syndrome, not elsewhere classified: Secondary | ICD-10-CM | POA: Diagnosis present

## 2024-02-04 ENCOUNTER — Ambulatory Visit: Admitting: Podiatry
